# Patient Record
Sex: Female | Born: 1955 | Race: White | Hispanic: No | Marital: Married | Smoking: Never smoker
Health system: Southern US, Community
[De-identification: ages and names within clinical notes are randomized; demographics above are authoritative.]

## PROBLEM LIST (undated history)

## (undated) DIAGNOSIS — K219 Gastro-esophageal reflux disease without esophagitis: Secondary | ICD-10-CM

## (undated) DIAGNOSIS — E0500 Thyrotoxicosis with diffuse goiter without thyrotoxic crisis or storm: Secondary | ICD-10-CM

## (undated) DIAGNOSIS — R000 Tachycardia, unspecified: Secondary | ICD-10-CM

## (undated) DIAGNOSIS — R42 Dizziness and giddiness: Secondary | ICD-10-CM

## (undated) DIAGNOSIS — N83209 Unspecified ovarian cyst, unspecified side: Secondary | ICD-10-CM

## (undated) DIAGNOSIS — K760 Fatty (change of) liver, not elsewhere classified: Secondary | ICD-10-CM

## (undated) DIAGNOSIS — D649 Anemia, unspecified: Secondary | ICD-10-CM

## (undated) DIAGNOSIS — E0590 Thyrotoxicosis, unspecified without thyrotoxic crisis or storm: Secondary | ICD-10-CM

## (undated) DIAGNOSIS — N840 Polyp of corpus uteri: Secondary | ICD-10-CM

## (undated) DIAGNOSIS — M810 Age-related osteoporosis without current pathological fracture: Secondary | ICD-10-CM

## (undated) DIAGNOSIS — M1990 Unspecified osteoarthritis, unspecified site: Secondary | ICD-10-CM

## (undated) HISTORY — PX: COLONOSCOPY: SHX174

---

## 2018-07-23 ENCOUNTER — Other Ambulatory Visit: Payer: Self-pay | Admitting: Obstetrics & Gynecology

## 2018-07-23 DIAGNOSIS — Z1231 Encounter for screening mammogram for malignant neoplasm of breast: Secondary | ICD-10-CM

## 2018-09-02 ENCOUNTER — Ambulatory Visit
Admission: RE | Admit: 2018-09-02 | Discharge: 2018-09-02 | Disposition: A | Discharge status: Home / Self Care | Payer: BC Managed Care – PPO | Source: Ambulatory Visit | Attending: Obstetrics & Gynecology | Admitting: Obstetrics & Gynecology

## 2018-09-02 ENCOUNTER — Encounter: Payer: Self-pay | Admitting: Radiology

## 2018-09-02 DIAGNOSIS — Z1231 Encounter for screening mammogram for malignant neoplasm of breast: Secondary | ICD-10-CM | POA: Diagnosis not present

## 2019-07-27 ENCOUNTER — Other Ambulatory Visit: Payer: Self-pay | Admitting: Obstetrics & Gynecology

## 2019-07-27 DIAGNOSIS — Z1231 Encounter for screening mammogram for malignant neoplasm of breast: Secondary | ICD-10-CM

## 2019-09-06 ENCOUNTER — Encounter (INDEPENDENT_AMBULATORY_CARE_PROVIDER_SITE_OTHER): Payer: Self-pay

## 2019-09-06 ENCOUNTER — Ambulatory Visit
Admission: RE | Admit: 2019-09-06 | Discharge: 2019-09-06 | Disposition: A | Discharge status: Home / Self Care | Payer: 59 | Source: Ambulatory Visit | Attending: Obstetrics & Gynecology | Admitting: Obstetrics & Gynecology

## 2019-09-06 ENCOUNTER — Other Ambulatory Visit: Payer: Self-pay

## 2019-09-06 DIAGNOSIS — Z1231 Encounter for screening mammogram for malignant neoplasm of breast: Secondary | ICD-10-CM | POA: Diagnosis not present

## 2020-05-18 ENCOUNTER — Ambulatory Visit
Admission: EM | Admit: 2020-05-18 | Discharge: 2020-05-18 | Disposition: A | Discharge status: Home / Self Care | Payer: Medicare Other | Attending: Family Medicine | Admitting: Family Medicine

## 2020-05-18 ENCOUNTER — Other Ambulatory Visit: Payer: Self-pay

## 2020-05-18 DIAGNOSIS — U071 COVID-19: Secondary | ICD-10-CM | POA: Insufficient documentation

## 2020-05-18 LAB — SARS CORONAVIRUS 2 (TAT 6-24 HRS): SARS Coronavirus 2: POSITIVE — AB

## 2020-05-18 MED ORDER — MOLNUPIRAVIR 200 MG PO CAPS: 800.0000 mg | ORAL_CAPSULE | Freq: Two times a day (BID) | ORAL | 0 refills | Status: AC

## 2020-05-18 NOTE — ED Triage Notes (Signed)
Pt c/o scratchy throat, headache, nasal congestion and cough since yesterday. Pt reports her husband was seen here yesterday and tested POS to [place]. Pt denies f/n/v/d or other symptoms.

## 2020-05-18 NOTE — Discharge Instructions (Signed)
Rest.  Fluids.  Medication as prescribed.  Quarantine for 5 days from the onset of symptoms.  Take care  Dr. Lacinda Axon

## 2020-05-18 NOTE — ED Provider Notes (Signed)
[facility]    CSN: [id number] Arrival date & time: 05/18/20  1009      History   Chief Complaint Chief Complaint  Patient presents with  . Covid Exposure   HPI  65 year old female presents with recent Covid exposure and now Covid symptoms.  Symptoms started yesterday.  She reports gradual throat, headache, congestion, cough.  She has had some chills and some mild body aches.  Her husband was seen here yesterday and has tested positive COVID-19.  Patient believes that she has COVID-19.  She believes that she may have gotten it from visiting a long-term care facility.  No fever.  No relieving factors.  No other complaints.  Home Medications    Prior to Admission medications   Medication Sig Start Date End Date Taking? Authorizing Provider  alendronate (FOSAMAX) 70 MG tablet Take 70 mg by mouth once a week. 04/04/20  Yes [provider]  Calcium Carbonate (CALCIUM-CARB 600 PO) Take by mouth.   Yes [provider]  Cholecalciferol 25 MCG (1000 UT) tablet Vitamin D3 25 mcg (1,000 unit) tablet  Take 1 tablet twice a day by oral route.   Yes [provider]  cyanocobalamin 1000 MCG tablet Take 2 tablets daily for 2 weeks, then reduce to 1 tablet daily thereafter for Vitamin B12 Deficiency. 03/22/19  Yes [provider]  estradiol (ESTRACE) 0.1 MG/GM vaginal cream SMARTSIG:1 Gram(s) Vaginal 2-3 Times Weekly PRN 02/11/20  Yes [provider]  Molnupiravir 200 MG CAPS Take 4 capsules (800 mg total) by mouth in the morning and at bedtime for 5 days. 05/18/20 05/23/20 Yes Kaitlyne Friedhoff G, DO  triamcinolone (NASACORT) 55 MCG/ACT AERO nasal inhaler Nasacort 55 mcg nasal spray aerosol  Take by nasal route.   Yes [provider]    Family History Family History  Problem Relation Age of Onset  . Breast cancer Neg Hx     Social History Social History   Tobacco Use  . Smoking status: Never Smoker  . Smokeless tobacco: Never Used   Vaping Use  . Vaping Use: Never used  Substance Use Topics  . Alcohol use: Not Currently  . Drug use: Never     Allergies   Patient has no known allergies.   Review of Systems Review of Systems Per HPI  Physical Exam Triage Vital Signs ED Triage Vitals  Enc Vitals Group     BP 05/18/20 1029 126/61     Pulse Rate 05/18/20 1029 (!) 107     Resp 05/18/20 1029 18     Temp 05/18/20 1029 98.4 F (36.9 C)     Temp Source 05/18/20 1029 Oral     SpO2 05/18/20 1029 100 %     Weight 05/18/20 1025 120 lb (54.4 kg)     Height 05/18/20 1025 5\' 5"  (1.651 m)     Head Circumference --      Peak Flow --      Pain Score 05/18/20 1025 0     Pain Loc --      Pain Edu? --      Excl. in [place]? --    Updated Vital Signs BP 126/61 (BP Location: Left Arm)   Pulse (!) 107   Temp 98.4 F (36.9 C) (Oral)   Resp 18   Ht 5\' 5"  (1.651 m)   Wt 54.4 kg   SpO2 100%   BMI 19.97 kg/m   Visual Acuity Right Eye Distance:   Left Eye Distance:  Bilateral Distance:    Right Eye Near:   Left Eye Near:    Bilateral Near:     Physical Exam Vitals and nursing note reviewed.  Constitutional:      General: She is not in acute distress.    Appearance: Normal appearance. She is not ill-appearing.  HENT:     Head: Normocephalic and atraumatic.  Eyes:     General:        Right eye: No discharge.        Left eye: No discharge.     Conjunctiva/sclera: Conjunctivae normal.  Cardiovascular:     Rate and Rhythm: Regular rhythm. Tachycardia present.  Pulmonary:     Effort: Pulmonary effort is normal.     Breath sounds: Normal breath sounds. No wheezing, rhonchi or rales.  Neurological:     Mental Status: She is alert.  Psychiatric:        Mood and Affect: Mood normal.        Behavior: Behavior normal.    UC Treatments / Results  Labs (all labs ordered are listed, but only abnormal results are displayed) Labs Reviewed  SARS CORONAVIRUS 2 (TAT 6-24 HRS)    EKG   Radiology No results  found.  Procedures Procedures (including critical care time)  Medications Ordered in UC Medications - No data to display  Initial Impression / Assessment and Plan / UC Course  I have reviewed the triage vital signs and the nursing notes.  Pertinent labs & imaging results that were available during my care of the patient were reviewed by me and considered in my medical decision making (see chart for details).    65 year old female presents with suspected COVID-19.  Given age, and direct exposure and confident that she has COVID-19.  Placing on Molnupiravir.   Final Clinical Impressions(s) / UC Diagnoses   Final diagnoses:  COVID     Discharge Instructions     Rest.  Fluids.  Medication as prescribed.  Quarantine for 5 days from the onset of symptoms.  Take care  Dr. Lacinda Axon    ED Prescriptions    Medication Sig Dispense Auth. Provider   Molnupiravir 200 MG CAPS Take 4 capsules (800 mg total) by mouth in the morning and at bedtime for 5 days. 40 capsule Coral Spikes, DO     PDMP not reviewed this encounter.   Coral Spikes, DO 05/18/20 1120

## 2020-08-03 ENCOUNTER — Other Ambulatory Visit: Payer: Self-pay | Admitting: Certified Nurse Midwife

## 2020-08-03 DIAGNOSIS — Z1231 Encounter for screening mammogram for malignant neoplasm of breast: Secondary | ICD-10-CM

## 2020-09-06 ENCOUNTER — Ambulatory Visit
Admission: RE | Admit: 2020-09-06 | Discharge: 2020-09-06 | Disposition: A | Discharge status: Home / Self Care | Payer: Medicare Other | Source: Ambulatory Visit | Attending: Certified Nurse Midwife | Admitting: Certified Nurse Midwife

## 2020-09-06 ENCOUNTER — Other Ambulatory Visit: Payer: Self-pay

## 2020-09-06 DIAGNOSIS — Z1231 Encounter for screening mammogram for malignant neoplasm of breast: Secondary | ICD-10-CM | POA: Diagnosis present

## 2020-10-04 ENCOUNTER — Encounter: Payer: Self-pay | Admitting: *Deleted

## 2020-10-05 ENCOUNTER — Ambulatory Visit: Payer: Medicare Other | Admitting: Anesthesiology

## 2020-10-05 ENCOUNTER — Other Ambulatory Visit: Payer: Self-pay

## 2020-10-05 ENCOUNTER — Encounter: Payer: Self-pay | Admitting: *Deleted

## 2020-10-05 ENCOUNTER — Ambulatory Visit
Admission: RE | Admit: 2020-10-05 | Discharge: 2020-10-05 | Disposition: A | Discharge status: Home / Self Care | Payer: Medicare Other | Attending: Gastroenterology | Admitting: Gastroenterology

## 2020-10-05 ENCOUNTER — Encounter
Admission: RE | Disposition: A | Discharge status: Home / Self Care | Payer: Self-pay | Source: Home / Self Care | Attending: Gastroenterology

## 2020-10-05 DIAGNOSIS — Z1211 Encounter for screening for malignant neoplasm of colon: Secondary | ICD-10-CM | POA: Diagnosis not present

## 2020-10-05 DIAGNOSIS — Z87440 Personal history of urinary (tract) infections: Secondary | ICD-10-CM | POA: Diagnosis not present

## 2020-10-05 DIAGNOSIS — Z7983 Long term (current) use of bisphosphonates: Secondary | ICD-10-CM | POA: Diagnosis not present

## 2020-10-05 DIAGNOSIS — K640 First degree hemorrhoids: Secondary | ICD-10-CM | POA: Insufficient documentation

## 2020-10-05 DIAGNOSIS — K5730 Diverticulosis of large intestine without perforation or abscess without bleeding: Secondary | ICD-10-CM | POA: Insufficient documentation

## 2020-10-05 DIAGNOSIS — Z79899 Other long term (current) drug therapy: Secondary | ICD-10-CM | POA: Diagnosis not present

## 2020-10-05 DIAGNOSIS — D120 Benign neoplasm of cecum: Secondary | ICD-10-CM | POA: Diagnosis not present

## 2020-10-05 HISTORY — DX: Thyrotoxicosis, unspecified without thyrotoxic crisis or storm: E05.90

## 2020-10-05 HISTORY — DX: Thyrotoxicosis with diffuse goiter without thyrotoxic crisis or storm: E05.00

## 2020-10-05 HISTORY — PX: COLONOSCOPY WITH PROPOFOL: SHX5780

## 2020-10-05 SURGERY — COLONOSCOPY WITH PROPOFOL
Anesthesia: General

## 2020-10-05 MED ORDER — SODIUM CHLORIDE 0.9 % IV SOLN: INTRAVENOUS | Status: DC

## 2020-10-05 MED ORDER — PHENYLEPHRINE HCL (PRESSORS) 10 MG/ML IV SOLN
INTRAVENOUS | Status: DC | PRN
  Administered 2020-10-05: 100 ug via INTRAVENOUS

## 2020-10-05 MED ORDER — PROPOFOL 10 MG/ML IV BOLUS
INTRAVENOUS | Status: DC | PRN
  Administered 2020-10-05: 80 mg via INTRAVENOUS

## 2020-10-05 MED ORDER — PROPOFOL 500 MG/50ML IV EMUL
INTRAVENOUS | Status: DC | PRN
  Administered 2020-10-05: 150 ug/kg/min via INTRAVENOUS

## 2020-10-05 MED ORDER — LIDOCAINE HCL (CARDIAC) PF 100 MG/5ML IV SOSY
PREFILLED_SYRINGE | INTRAVENOUS | Status: DC | PRN
  Administered 2020-10-05: 50 mg via INTRAVENOUS

## 2020-10-05 NOTE — H&P (Signed)
[facility] Pre-Procedure H&P   Patient ID: Karina Simpson is a 65 y.o. female.  Gastroenterology Provider: Annamaria Helling, DO  Referring Provider: Toni Arthurs, NP PCP: Toni Arthurs, NP  Date: 10/05/2020  HPI Ms. Karina Simpson is a 65 y.o. female who presents today for Colonoscopy for ersonal history of colon polyps.  Patient denies nausea, vomiting, abdominal pain, diarrhea, constipation, melena, hematochezia.  Underwent colonoscopy in 2017 with patient reported Tubular adenoma (x1) with recommended 5 year f/u. Hgb normal. Abdominal surgery- c section.  No other acute gi complaints.   Past Medical History:  Diagnosis Date   Hyperthyroidism     Past Surgical History:  Procedure Laterality Date   CESAREAN SECTION     COLONOSCOPY      Family History Paternal uncle with h/o crc (72) No h/o GI disease or malignancy  Review of Systems  Constitutional:  Negative for activity change, appetite change, chills, fatigue, fever and unexpected weight change.  HENT:  Negative for sinus pain, trouble swallowing and voice change.   Respiratory:  Negative for shortness of breath and wheezing.   Cardiovascular:  Negative for chest pain, palpitations and leg swelling.  Gastrointestinal:  Negative for abdominal distention, abdominal pain, anal bleeding, blood in stool, constipation, diarrhea, nausea, rectal pain and vomiting.  Musculoskeletal:  Negative for arthralgias and myalgias.  Skin:  Negative for color change and pallor.  Neurological:  Negative for dizziness, syncope and weakness.  Psychiatric/Behavioral:  Negative for agitation and confusion.   All other systems reviewed and are negative.   Medications No current facility-administered medications on file prior to encounter.   Current Outpatient Medications on File Prior to Encounter  Medication Sig Dispense Refill   Calcium Carbonate (CALCIUM-CARB 600 PO) Take by mouth.     Cholecalciferol 25 MCG  (1000 UT) tablet Vitamin D3 25 mcg (1,000 unit) tablet  Take 1 tablet twice a day by oral route.     cyanocobalamin 1000 MCG tablet Take 2 tablets daily for 2 weeks, then reduce to 1 tablet daily thereafter for Vitamin B12 Deficiency.     estradiol (ESTRACE) 0.1 MG/GM vaginal cream SMARTSIG:1 Gram(s) Vaginal 2-3 Times Weekly PRN     metoprolol succinate (TOPROL-XL) 25 MG 24 hr tablet Take 25 mg by mouth daily.     triamcinolone (NASACORT) 55 MCG/ACT AERO nasal inhaler Nasacort 55 mcg nasal spray aerosol  Take by nasal route.     White Petrolatum-Mineral Oil (SYSTANE NIGHTTIME OP) Apply 1 application to eye 3 (three) times daily.     alendronate (FOSAMAX) 70 MG tablet Take 70 mg by mouth once a week.      Pertinent medications related to GI and procedure were reviewed by me with the patient prior to the procedure   Current Facility-Administered Medications:    0.9 %  sodium chloride infusion, , Intravenous, Continuous, Annamaria Helling, DO      No Known Allergies Allergies were reviewed by me prior to the procedure  Objective    Vitals:   10/05/20 0800  BP: 128/68  Pulse: 68  Resp: 18  Temp: 97.9 F (36.6 C)  SpO2: 100%  Weight: 57.6 kg  Height: '5\' 5"'$  (1.651 m)     Physical Exam Vitals reviewed.  Constitutional:      General: She is not in acute distress.    Appearance: Normal appearance. She is not ill-appearing, toxic-appearing or diaphoretic.  HENT:     Head: Normocephalic and atraumatic.     Nose: Nose normal.  Mouth/Throat:     Mouth: Mucous membranes are moist.     Pharynx: Oropharynx is clear.  Eyes:     General: No scleral icterus.    Extraocular Movements: Extraocular movements intact.  Cardiovascular:     Rate and Rhythm: Normal rate and regular rhythm.     Heart sounds: Normal heart sounds. No murmur heard.   No friction rub. No gallop.  Pulmonary:     Effort: Pulmonary effort is normal. No respiratory distress.     Breath sounds: Normal  breath sounds. No wheezing, rhonchi or rales.  Abdominal:     General: Bowel sounds are normal. There is no distension.     Palpations: Abdomen is soft.     Tenderness: There is no abdominal tenderness. There is no guarding or rebound.  Musculoskeletal:     Cervical back: Neck supple.     Right lower leg: No edema.     Left lower leg: No edema.  Skin:    General: Skin is warm and dry.     Coloration: Skin is not jaundiced or pale.     Findings: No rash.  Neurological:     General: No focal deficit present.     Mental Status: She is alert and oriented to person, place, and time. Mental status is at baseline.  Psychiatric:        Mood and Affect: Mood normal.        Behavior: Behavior normal.        Thought Content: Thought content normal.        Judgment: Judgment normal.     Assessment:  Ms. Karina Simpson is a 65 y.o. female  who presents today for Colonoscopy for personal history of colon polyps.  Plan:  Colonoscopy with possible intervention today  Colonoscopy with possible biopsy, control of bleeding, polypectomy, and interventions as necessary has been discussed with the patient/patient representative. Informed consent was obtained from the patient/patient representative after explaining the indication, nature, and risks of the procedure including but not limited to death, bleeding, perforation, missed neoplasm/lesions, cardiorespiratory compromise, and reaction to medications. Opportunity for questions was given and appropriate answers were provided. Patient/patient representative has verbalized understanding is amenable to undergoing the procedure.   Annamaria Helling, DO  [facility]  Portions of the record may have been created with voice recognition software. Occasional wrong-word or 'sound-a-like' substitutions may have occurred due to the inherent limitations of voice recognition software.  Read the chart carefully and recognize, using context, where  substitutions may have occurred.

## 2020-10-05 NOTE — Anesthesia Procedure Notes (Signed)
Date/Time: 10/05/2020 9:40 AM Performed by: Johnna Acosta, CRNA Pre-anesthesia Checklist: Patient identified, Emergency Drugs available, Suction available and Patient being monitored Patient Re-evaluated:Patient Re-evaluated prior to induction Oxygen Delivery Method: Nasal cannula Preoxygenation: Pre-oxygenation with 100% oxygen Induction Type: IV induction

## 2020-10-05 NOTE — Anesthesia Preprocedure Evaluation (Addendum)
Anesthesia Evaluation  Patient identified by MRN, date of birth, ID band Patient awake    Reviewed: Allergy & Precautions, NPO status , Patient's Chart, lab work & pertinent test results, reviewed documented beta blocker date and time   Airway Mallampati: II  TM Distance: >3 FB Neck ROM: full    Dental  (+) Teeth Intact   Pulmonary neg pulmonary ROS,    Pulmonary exam normal        Cardiovascular Normal cardiovascular exam+ dysrhythmias (tachycardia)      Neuro/Psych negative neurological ROS  negative psych ROS   GI/Hepatic negative GI ROS, Neg liver ROS,   Endo/Other  Hyperthyroidism   Renal/GU negative Renal ROS  negative genitourinary   Musculoskeletal   Abdominal Normal abdominal exam  (+)   Peds  Hematology negative hematology ROS (+)   Anesthesia Other Findings Past Medical History: No date: Hyperthyroidism  Past Surgical History: No date: CESAREAN SECTION No date: COLONOSCOPY     Reproductive/Obstetrics negative OB ROS                            Anesthesia Physical Anesthesia Plan  ASA: 2  Anesthesia Plan: General   Post-op Pain Management:    Induction: Intravenous  PONV Risk Score and Plan: 3 and Propofol infusion, TIVA and Treatment may vary due to age or medical condition  Airway Management Planned: Natural Airway and Nasal Cannula  Additional Equipment:   Intra-op Plan:   Post-operative Plan:   Informed Consent: I have reviewed the patients History and Physical, chart, labs and discussed the procedure including the risks, benefits and alternatives for the proposed anesthesia with the patient or authorized representative who has indicated his/her understanding and acceptance.     Dental Advisory Given  Plan Discussed with: Anesthesiologist, CRNA and Surgeon  Anesthesia Plan Comments:         Anesthesia Quick Evaluation

## 2020-10-05 NOTE — Anesthesia Postprocedure Evaluation (Signed)
Anesthesia Post Note  Patient: Karina Simpson  Procedure(s) Performed: COLONOSCOPY WITH PROPOFOL  Patient location during evaluation: Endoscopy Anesthesia Type: General Level of consciousness: awake and alert Pain management: pain level controlled Vital Signs Assessment: post-procedure vital signs reviewed and stable Respiratory status: spontaneous breathing, nonlabored ventilation and respiratory function stable Cardiovascular status: blood pressure returned to baseline and stable Postop Assessment: no apparent nausea or vomiting Anesthetic complications: no   No notable events documented.   Last Vitals:  Vitals:   10/05/20 1038 10/05/20 1040  BP: (!) 101/47 (!) 103/56  Pulse: 63 64  Resp: 19 15  Temp:    SpO2: 100% 96%    Last Pain:  Vitals:   10/05/20 1020  TempSrc: Temporal  PainSc:                  [place] Ouch

## 2020-10-05 NOTE — Transfer of Care (Signed)
Immediate Anesthesia Transfer of Care Note  Patient: Karina Simpson  Procedure(s) Performed: COLONOSCOPY WITH PROPOFOL  Patient Location: PACU  Anesthesia Type:General  Level of Consciousness: sedated  Airway & Oxygen Therapy: Patient Spontanous Breathing  Post-op Assessment: Report given to RN and Post -op Vital signs reviewed and stable  Post vital signs: Reviewed and stable  Last Vitals:  Vitals Value Taken Time  BP 103/56 10/05/20 1040  Temp 35.6 C 10/05/20 1020  Pulse 59 10/05/20 1044  Resp 12 10/05/20 1046  SpO2 100 % 10/05/20 1044  Vitals shown include unvalidated device data.  Last Pain:  Vitals:   10/05/20 1020  TempSrc: Temporal  PainSc:          Complications: No notable events documented.

## 2020-10-05 NOTE — Op Note (Signed)
[facility] Patient Name: Karina Simpson Procedure Date: 10/05/2020 9:38 AM MRN: [id number] Account #: [id number] Date of Birth: 05/21/1955 Admit Type: Outpatient Age: 65 Room: [facility] ENDO ROOM 1 Gender: Female Note Status: Finalized Procedure:             Colonoscopy Indications:           High risk colon cancer surveillance: Personal history                         of colonic polyps Providers:             Rueben Bash, DO Referring MD:          Toni Arthurs (Referring MD) Medicines:             Monitored Anesthesia Care Complications:         No immediate complications. Estimated blood loss:                         Minimal. Procedure:             Pre-Anesthesia Assessment:                        - Prior to the procedure, a History and Physical was                         performed, and patient medications and allergies were                         reviewed. The patient is competent. The risks and                         benefits of the procedure and the sedation options and                         risks were discussed with the patient. All questions                         were answered and informed consent was obtained.                         Patient identification and proposed procedure were                         verified by the physician, the nurse, the anesthetist                         and the technician in the endoscopy suite. Mental                         Status Examination: alert and oriented. Airway                         Examination: normal oropharyngeal airway and neck                         mobility. Respiratory Examination: clear to  auscultation. CV Examination: RRR, no murmurs, no S3                         or S4. Prophylactic Antibiotics: The patient does not                         require prophylactic antibiotics. Prior                         Anticoagulants: The patient has taken no  previous                         anticoagulant or antiplatelet agents. ASA Grade                         Assessment: II - A patient with mild systemic disease.                         After reviewing the risks and benefits, the patient                         was deemed in satisfactory condition to undergo the                         procedure. The anesthesia plan was to use monitored                         anesthesia care (MAC). Immediately prior to                         administration of medications, the patient was                         re-assessed for adequacy to receive sedatives. The                         heart rate, respiratory rate, oxygen saturations,                         blood pressure, adequacy of pulmonary ventilation, and                         response to care were monitored throughout the                         procedure. The physical status of the patient was                         re-assessed after the procedure.                        After obtaining informed consent, the colonoscope was                         passed under direct vision. Throughout the procedure,                         the patient's blood pressure, pulse, and oxygen  saturations were monitored continuously. The                         Colonoscope was introduced through the anus and                         advanced to the the terminal ileum, with                         identification of the appendiceal orifice and IC                         valve. The colonoscopy was performed without                         difficulty. The patient tolerated the procedure well.                         The quality of the bowel preparation was evaluated                         using the BBPS [facility] Bowel Preparation Scale) with                         scores of: Right Colon = 2 (minor amount of residual                         staining, small fragments of stool and/or opaque                          liquid, but mucosa seen well), Transverse Colon = 2                         (minor amount of residual staining, small fragments of                         stool and/or opaque liquid, but mucosa seen well) and                         Left Colon = 3 (entire mucosa seen well with no                         residual staining, small fragments of stool or opaque                         liquid). The total BBPS score equals 7. The quality of                         the bowel preparation was good. The terminal ileum,                         ileocecal valve, appendiceal orifice, and rectum were                         photographed. Findings:      The perianal and digital rectal examinations were normal. Pertinent       negatives include normal sphincter  tone.      The terminal ileum appeared normal. Estimated blood loss: none.      A 2 to 3 mm polyp was found in the cecum. The polyp was sessile. The       polyp was removed with a cold biopsy forceps. Resection and retrieval       were complete. Estimated blood loss was minimal.      Non-bleeding internal hemorrhoids were found during retroflexion. The       hemorrhoids were Grade I (internal hemorrhoids that do not prolapse).      The exam was otherwise without abnormality on direct and retroflexion       views.      Multiple small-mouthed diverticula were found in the sigmoid colon and       descending colon. Estimated blood loss: none. Impression:            - The examined portion of the ileum was normal.                        - One 2 to 3 mm polyp in the cecum, removed with a                         cold biopsy forceps. Resected and retrieved.                        - Non-bleeding internal hemorrhoids.                        - The examination was otherwise normal on direct and                         retroflexion views. Recommendation:        - Discharge patient to home.                        - Resume previous diet.                         - Continue present medications.                        - Await pathology results.                        - Repeat colonoscopy for surveillance based on                         pathology results.                        - Return to referring physician. Procedure Code(s):     --- Professional ---                        [phone], Colonoscopy, flexible; with biopsy, single or                         multiple Diagnosis Code(s):     --- Professional ---                        Z86.010, Personal history of colonic polyps  K64.0, First degree hemorrhoids                        K63.5, Polyp of colon CPT copyright 2019 [employer]. All rights reserved. The codes documented in this report are preliminary and upon coder review may  be revised to meet current compliance requirements. Attending Participation:      I personally performed the entire procedure. Volney American, DO Annamaria Helling DO, DO 10/05/2020 10:19:29 AM This report has been signed electronically. Number of Addenda: 0 Note Initiated On: 10/05/2020 9:38 AM Scope Withdrawal Time: 0 hours 14 minutes 36 seconds  Total Procedure Duration: 0 hours 30 minutes 4 seconds  Estimated Blood Loss:  Estimated blood loss was minimal.      [facility]

## 2020-10-06 ENCOUNTER — Encounter: Payer: Self-pay | Admitting: Gastroenterology

## 2020-10-06 LAB — SURGICAL PATHOLOGY

## 2021-08-08 ENCOUNTER — Other Ambulatory Visit: Payer: Self-pay | Admitting: Obstetrics and Gynecology

## 2021-08-08 DIAGNOSIS — Z1231 Encounter for screening mammogram for malignant neoplasm of breast: Secondary | ICD-10-CM

## 2021-09-20 ENCOUNTER — Ambulatory Visit
Admission: RE | Admit: 2021-09-20 | Discharge: 2021-09-20 | Disposition: A | Discharge status: Home / Self Care | Payer: Medicare Other | Source: Ambulatory Visit | Attending: Obstetrics and Gynecology | Admitting: Obstetrics and Gynecology

## 2021-09-20 DIAGNOSIS — Z1231 Encounter for screening mammogram for malignant neoplasm of breast: Secondary | ICD-10-CM | POA: Diagnosis present

## 2021-11-24 DIAGNOSIS — S199XXA Unspecified injury of neck, initial encounter: Secondary | ICD-10-CM

## 2021-11-24 HISTORY — DX: Unspecified injury of neck, initial encounter: S19.9XXA

## 2022-01-16 ENCOUNTER — Ambulatory Visit
Admission: EM | Admit: 2022-01-16 | Discharge: 2022-01-16 | Disposition: A | Discharge status: Home / Self Care | Payer: Medicare Other | Attending: Family Medicine | Admitting: Family Medicine

## 2022-01-16 ENCOUNTER — Encounter: Payer: Self-pay | Admitting: Emergency Medicine

## 2022-01-16 DIAGNOSIS — Z20822 Contact with and (suspected) exposure to covid-19: Secondary | ICD-10-CM

## 2022-01-16 DIAGNOSIS — U071 COVID-19: Secondary | ICD-10-CM

## 2022-01-16 MED ORDER — NIRMATRELVIR/RITONAVIR (PAXLOVID)TABLET: 3.0000 | ORAL_TABLET | Freq: Two times a day (BID) | ORAL | 0 refills | Status: AC

## 2022-01-16 MED ORDER — NIRMATRELVIR/RITONAVIR (PAXLOVID)TABLET: 3.0000 | ORAL_TABLET | Freq: Two times a day (BID) | ORAL | 0 refills | Status: DC

## 2022-01-16 MED ORDER — MOLNUPIRAVIR EUA 200MG CAPSULE: 4.0000 | ORAL_CAPSULE | Freq: Two times a day (BID) | ORAL | 0 refills | Status: DC

## 2022-01-16 NOTE — Discharge Instructions (Addendum)
Your home test for COVID-19 was positive, meaning that you were infected with the novel coronavirus and could give the germ to others.  Please continue isolation at home for at least 5 days since the start of your symptoms. Once you complete your 5 day quarantine, you may return to normal activities as long as you've not had a fever for over 24 hours(without taking fever reducing medicine) and your symptoms are improving. Be sure to wear a mask until Day 11.   If your were prescribed medication. Stop by the pharmacy to pick it up.   Please continue good preventive care measures, including:  frequent hand-washing, avoid touching your face, cover coughs/sneezes, stay out of crowds and keep a 6 foot distance from others.  Go to the nearest hospital emergency room if fever/cough/breathlessness are severe or illness seems like a threat to life.  You can take Tylenol and/or Ibuprofen as needed for fever reduction and pain relief.    For cough: honey 1/2 to 1 teaspoon (you can dilute the honey in water or another fluid).  You can also use guaifenesin and dextromethorphan for cough. You can use a humidifier for chest congestion and cough.  If you don't have a humidifier, you can sit in the bathroom with the hot shower running.      For sore throat: try warm salt water gargles, Mucinex sore throat cough drops or cepacol lozenges, throat spray, warm tea or water with lemon/honey, popsicles or ice, or OTC cold relief medicine for throat discomfort. You can also purchase chloraseptic spray at the pharmacy or dollar store.   For congestion: take a daily anti-histamine like Zyrtec, Claritin, and a oral decongestant, such as pseudoephedrine.  You can also use Flonase 1-2 sprays in each nostril daily. Afrin is also a good option, if you do not have high blood pressure.    It is important to stay hydrated: drink plenty of fluids (water, gatorade/powerade/pedialyte, juices, or teas) to keep your throat moisturized and  help further relieve irritation/discomfort.    Return or go to the Emergency Department if symptoms worsen or do not improve in the next few days

## 2022-01-16 NOTE — ED Provider Notes (Signed)
[facility]    CSN: [id number] Arrival date & time: 01/16/22  1122      History   Chief Complaint Chief Complaint  Patient presents with   Covid Positive    HPI Karina Simpson is a 66 y.o. female.   HPI   Karina Simpson presents after having a positive home COVID test this morning. Of note, her husband tested positive for COVID on Monday.  Symptoms started yesterday.  Has cough, rhinorrhea and sore throat. Denies fever, body aches, nasal congestion, headache, vomiting or diarrhea. No chest pain or shortness of breath.        Past Medical History:  Diagnosis Date   Graves disease    Hyperthyroidism     There are no problems to display for this patient.   Past Surgical History:  Procedure Laterality Date   CESAREAN SECTION     COLONOSCOPY     COLONOSCOPY WITH PROPOFOL N/A 10/05/2020   Procedure: COLONOSCOPY WITH PROPOFOL;  Surgeon: Annamaria Helling, DO;  Location: [facility] ENDOSCOPY;  Service: Gastroenterology;  Laterality: N/A;  REQUESTS EARLY AM    OB History   No obstetric history on file.      Home Medications    Prior to Admission medications   Medication Sig Start Date End Date Taking? Authorizing Provider  meloxicam (MOBIC) 7.5 MG tablet Take by mouth. 12/31/21 03/01/22 Yes [provider]  molnupiravir EUA (LAGEVRIO) 200 mg CAPS capsule Take 4 capsules (800 mg total) by mouth 2 (two) times daily for 5 days. 01/16/22 01/21/22 Yes Tehilla Coffel, DO  alendronate (FOSAMAX) 70 MG tablet Take 70 mg by mouth once a week. 04/04/20   [provider]  Calcium Carbonate (CALCIUM-CARB 600 PO) Take by mouth.    [provider]  Cholecalciferol 25 MCG (1000 UT) tablet Vitamin D3 25 mcg (1,000 unit) tablet  Take 1 tablet twice a day by oral route.    [provider]  cyanocobalamin 1000 MCG tablet Take 2 tablets daily for 2 weeks, then reduce to 1 tablet daily thereafter for Vitamin B12 Deficiency. 03/22/19   [provider]  estradiol (ESTRACE) 0.1 MG/GM vaginal cream SMARTSIG:1 Gram(s) Vaginal 2-3 Times Weekly PRN 02/11/20   [provider]  metoprolol succinate (TOPROL-XL) 25 MG 24 hr tablet Take 25 mg by mouth daily.    [provider]  triamcinolone (NASACORT) 55 MCG/ACT AERO nasal inhaler Nasacort 55 mcg nasal spray aerosol  Take by nasal route.    [provider]  White Petrolatum-Mineral Oil (SYSTANE NIGHTTIME OP) Apply 1 application to eye 3 (three) times daily.    [provider]    Family History Family History  Problem Relation Age of Onset   Breast cancer Neg Hx     Social History Social History   Tobacco Use   Smoking status: Never   Smokeless tobacco: Never  Vaping Use   Vaping Use: Never used  Substance Use Topics   Alcohol use: Not Currently   Drug use: Never     Allergies   Patient has no known allergies.   Review of Systems Review of Systems: negative unless otherwise stated in HPI.      Physical Exam Triage Vital Signs ED Triage Vitals  Enc Vitals Group     BP 01/16/22 1312 (!) 148/74     Pulse Rate 01/16/22 1312 81     Resp 01/16/22 1312 16     Temp 01/16/22 1312 98.6 F (37 C)  Temp Source 01/16/22 1312 Oral     SpO2 01/16/22 1312 98 %     Weight --      Height --      Head Circumference --      Peak Flow --      Pain Score 01/16/22 1311 0     Pain Loc --      Pain Edu? --      Excl. in [place]? --    No data found.  Updated Vital Signs BP (!) 148/74 (BP Location: Right Arm)   Pulse 81   Temp 98.6 F (37 C) (Oral)   Resp 16   SpO2 98%   Visual Acuity Right Eye Distance:   Left Eye Distance:   Bilateral Distance:    Right Eye Near:   Left Eye Near:    Bilateral Near:     Physical Exam GEN:     alert, non-toxic appearing female in no distress    HENT:  mucus membranes moist, oropharyngeal without lesions or exudate, no tonsillar hypertrophy, no oropharyngeal erythema, clear nasal discharge,  bilateral TM normal EYES:   pupils equal and reactive, no scleral injection or discharge NECK:  baseline ROM, no meningismus   RESP:  no increased work of breathing, clear to auscultation bilaterally CVS:   regular rate and rhythm Skin:   warm and dry    UC Treatments / Results  Labs (all labs ordered are listed, but only abnormal results are displayed) Labs Reviewed - No data to display  EKG   Radiology No results found.  Procedures Procedures (including critical care time)  Medications Ordered in UC Medications - No data to display  Initial Impression / Assessment and Plan / UC Course  I have reviewed the triage vital signs and the nursing notes.  Pertinent labs & imaging results that were available during my care of the patient were reviewed by me and considered in my medical decision making (see chart for details).       Patient is a 65 y.o. female who presents after testing positive for at home with known close COVID exposure.  She has had COVID once before. Overall she is non-toxic appearing, well-hydrated and in no respiratory distress. I saw her COVID positive home test. Repeat COVID testing deferred.    Discussed symptomatic treatment. Pulmonary exam she has equal aeration bilaterally, imaging deferred.  She is  interested in treatment for COVID. After shared decision making, she is interested in [facility] as has taken this before.  Rx sent to pharmacy.  Work note declined. Isolation and quarantine instructions provided. Typical duration of symptoms discussed. ED and return precautions and understanding voiced. Discussed MDM, treatment plan and plan for follow-up with patient/parent who agrees with plan.      Final Clinical Impressions(s) / UC Diagnoses   Final diagnoses:  [id number]     Discharge Instructions      Your home test for COVID-19 was positive, meaning that you were infected with the novel coronavirus and could give the germ to others.  Please  continue isolation at home for at least 5 days since the start of your symptoms. Once you complete your 5 day quarantine, you may return to normal activities as long as you've not had a fever for over 24 hours(without taking fever reducing medicine) and your symptoms are improving. Be sure to wear a mask until Day 11.   If your were prescribed medication. Stop by the pharmacy to pick it up.  Please continue good preventive care measures, including:  frequent hand-washing, avoid touching your face, cover coughs/sneezes, stay out of crowds and keep a 6 foot distance from others.  Go to the nearest hospital emergency room if fever/cough/breathlessness are severe or illness seems like a threat to life.  You can take Tylenol and/or Ibuprofen as needed for fever reduction and pain relief.    For cough: honey 1/2 to 1 teaspoon (you can dilute the honey in water or another fluid).  You can also use guaifenesin and dextromethorphan for cough. You can use a humidifier for chest congestion and cough.  If you don't have a humidifier, you can sit in the bathroom with the hot shower running.      For sore throat: try warm salt water gargles, Mucinex sore throat cough drops or cepacol lozenges, throat spray, warm tea or water with lemon/honey, popsicles or ice, or OTC cold relief medicine for throat discomfort. You can also purchase chloraseptic spray at the pharmacy or dollar store.   For congestion: take a daily anti-histamine like Zyrtec, Claritin, and a oral decongestant, such as pseudoephedrine.  You can also use Flonase 1-2 sprays in each nostril daily. Afrin is also a good option, if you do not have high blood pressure.    It is important to stay hydrated: drink plenty of fluids (water, gatorade/powerade/pedialyte, juices, or teas) to keep your throat moisturized and help further relieve irritation/discomfort.    Return or go to the Emergency Department if symptoms worsen or do not improve in the next few  days      ED Prescriptions     Medication Sig Dispense Auth. Provider   molnupiravir EUA (LAGEVRIO) 200 mg CAPS capsule Take 4 capsules (800 mg total) by mouth 2 (two) times daily for 5 days. 40 capsule Airam Heidecker, DO      PDMP not reviewed this encounter.   Lyndee Hensen, DO 01/16/22 1336

## 2022-01-16 NOTE — ED Triage Notes (Signed)
Pt presents with runny nose, sore throat and cough since yesterday. At home covid test this morning was positive.

## 2022-01-24 IMAGING — MG MM DIGITAL SCREENING BILAT W/ TOMO AND CAD
8 series · 9 of 24 positions shown · non-contrast
Comparison: Previous exam(s).

CLINICAL DATA: Screening.

EXAM:
DIGITAL SCREENING BILATERAL MAMMOGRAM WITH TOMOSYNTHESIS AND CAD
TECHNIQUE: Bilateral screening digital craniocaudal and mediolateral oblique
mammograms were obtained. Bilateral screening digital breast
tomosynthesis was performed. The images were evaluated with
computer-aided detection.

[L MLO synth-2D]
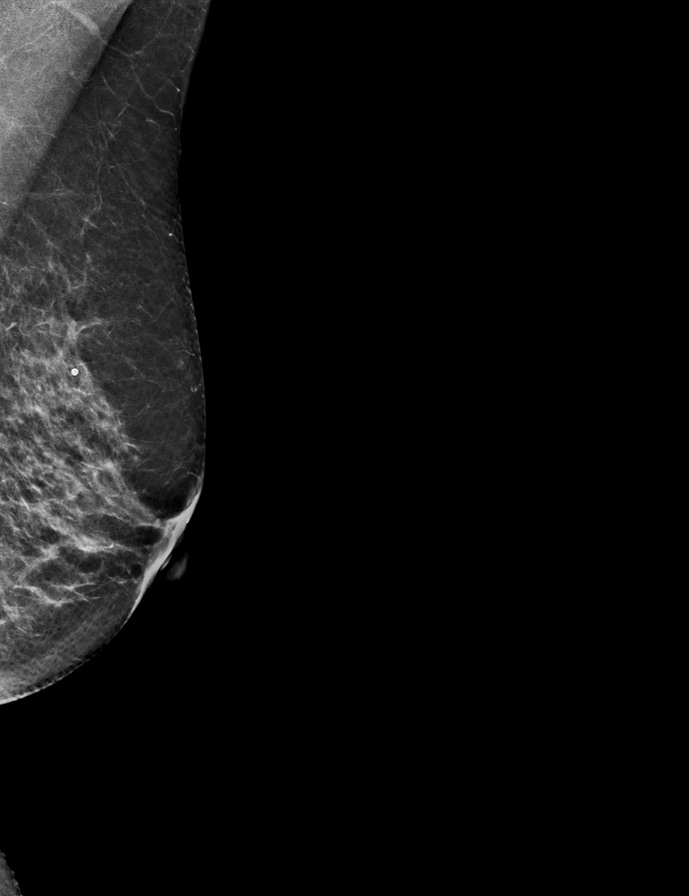

[R MLO synth-2D]
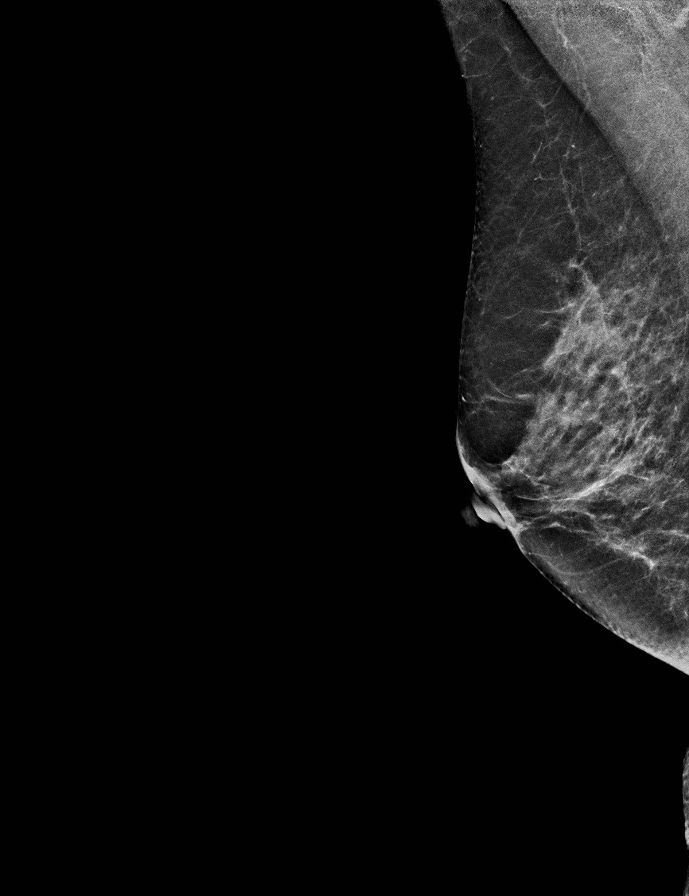

[L CC synth-2D]
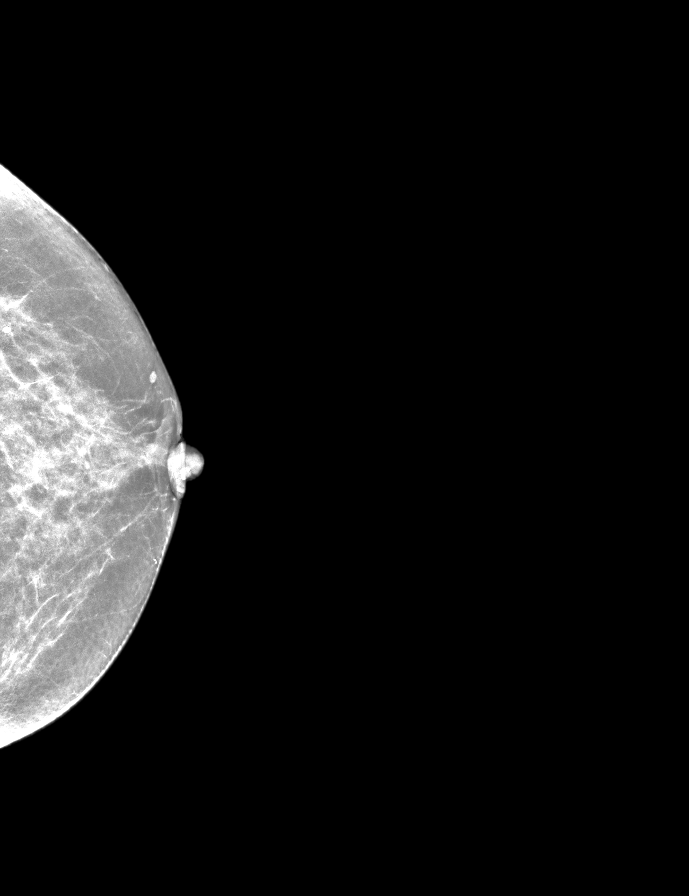

[R CC synth-2D]
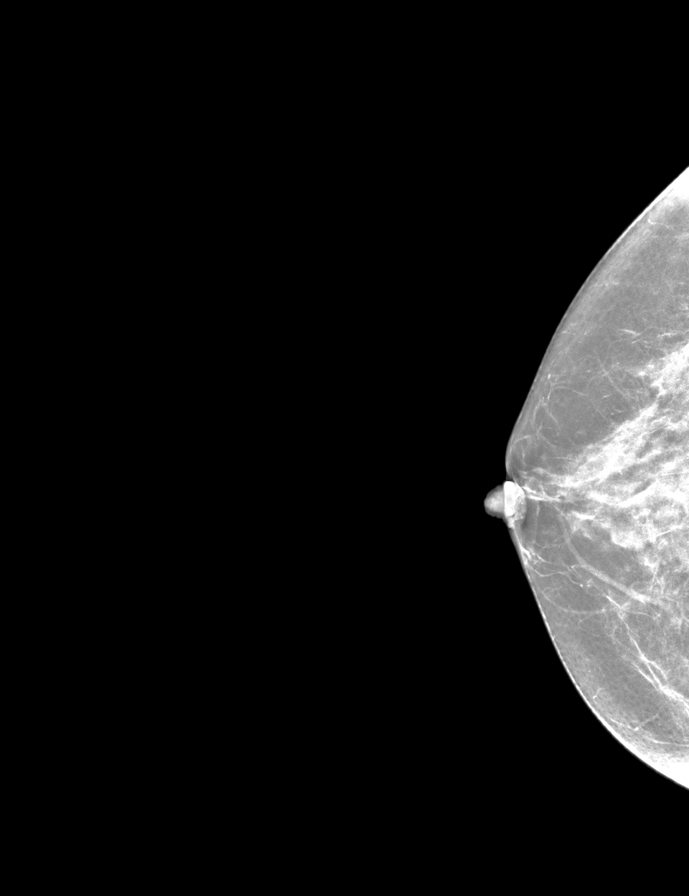

[L MLO tomo · 2 of 55 frames shown]
[frame 18/55]
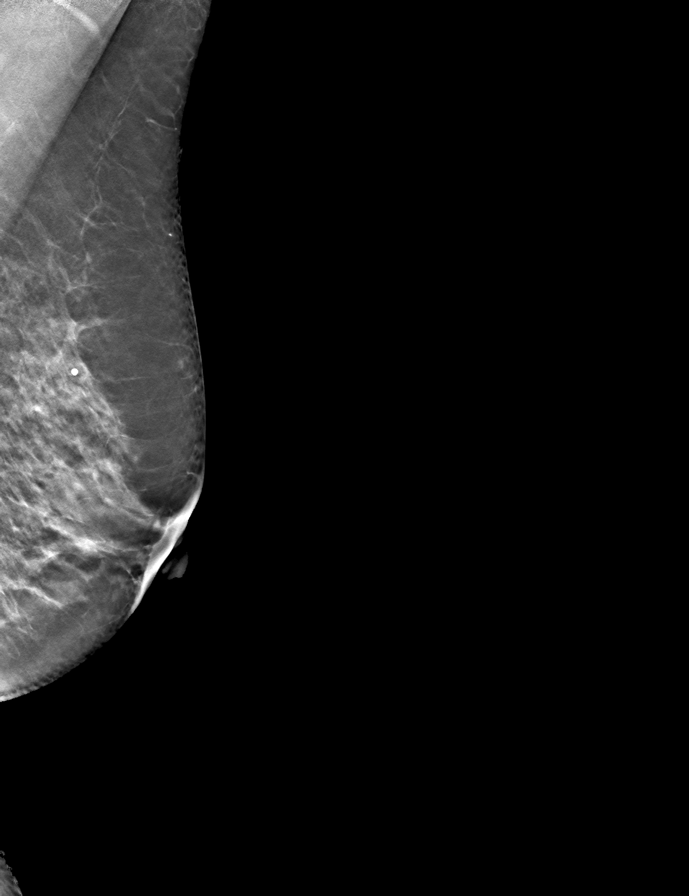
[frame 28/55]
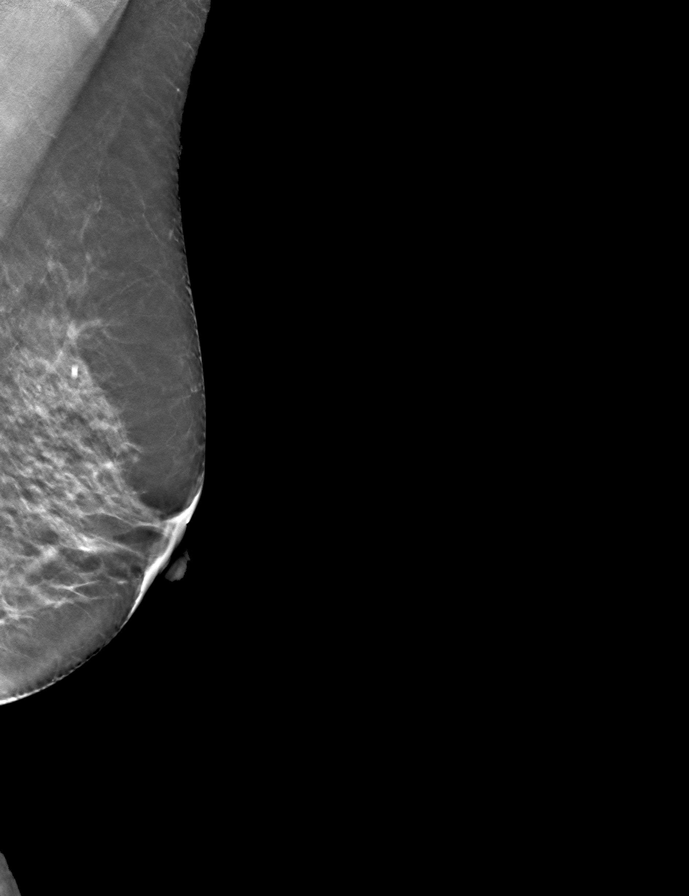

[R MLO tomo · tomo slice 29/56.0]
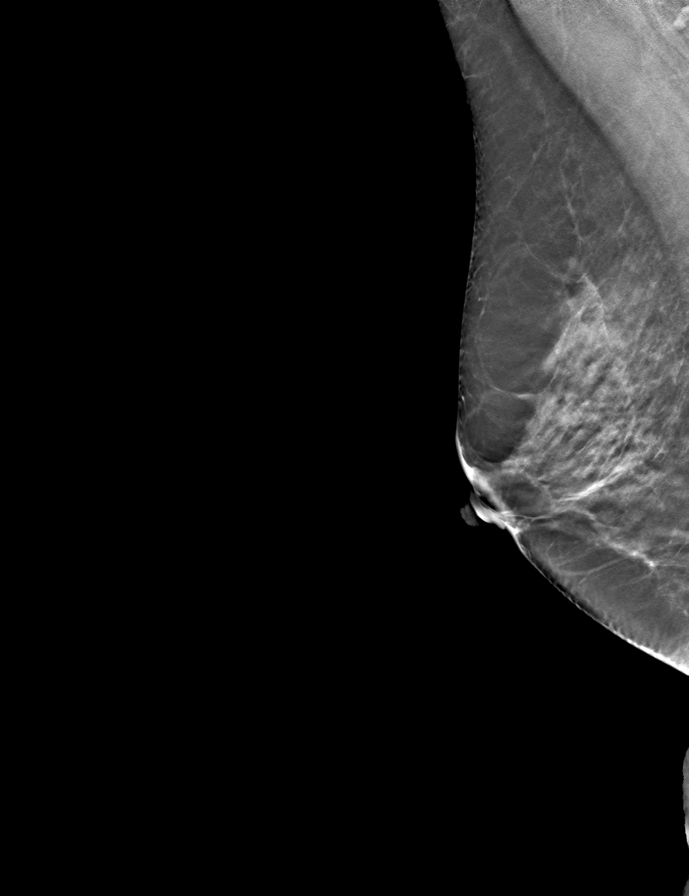

[L CC tomo · tomo slice 27/54.0]
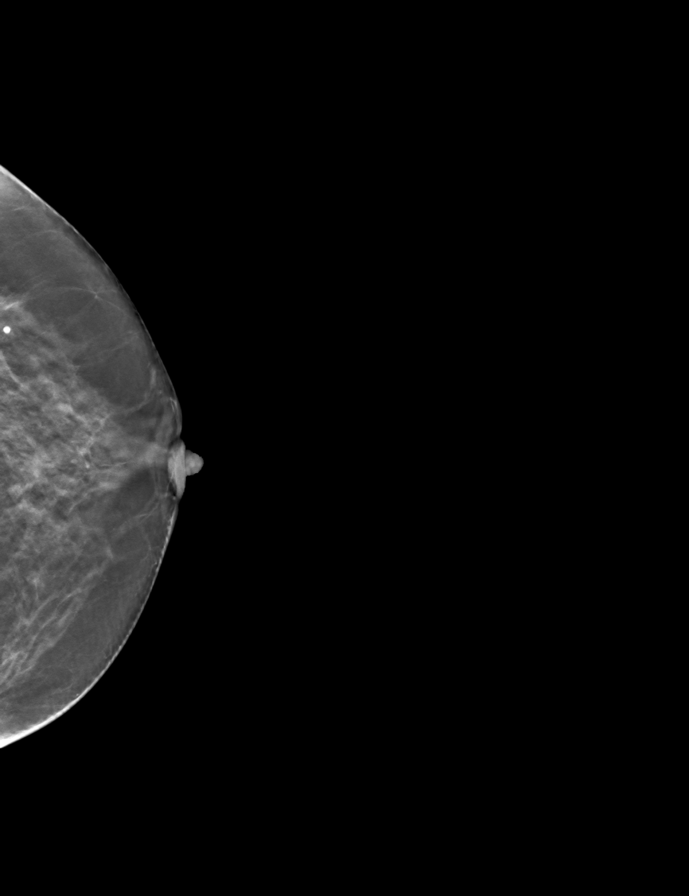

[R CC tomo · tomo slice 28/55.0]
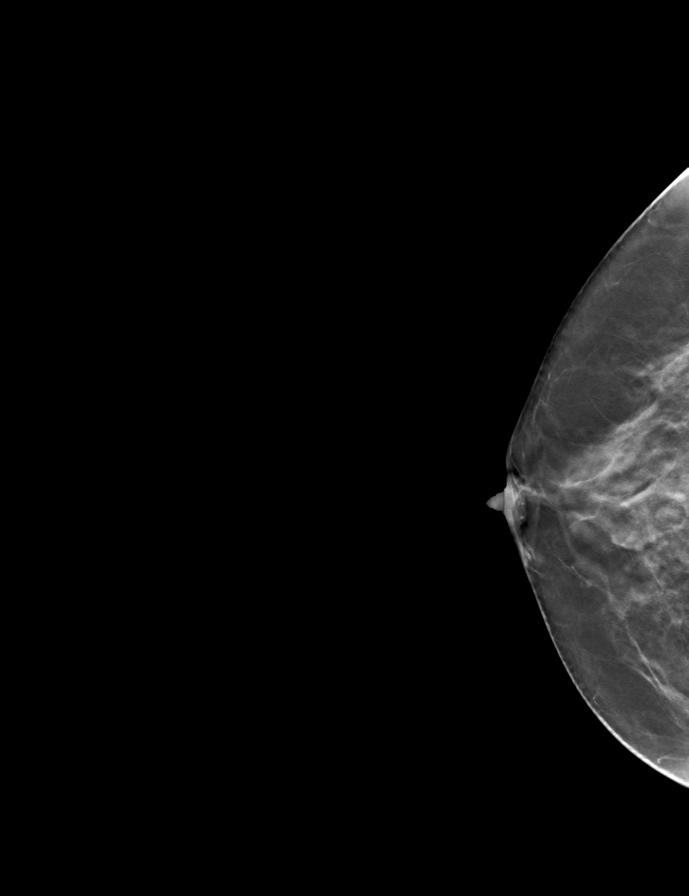

[9 of 24 positions shown; findings below may reference images not displayed]

ACR Breast Density Category c: The breast tissue is heterogeneously
dense, which may obscure small masses.
FINDINGS: There are no findings suspicious for malignancy.
IMPRESSION: No mammographic evidence of malignancy. A result letter of this
screening mammogram will be mailed directly to the patient.

RECOMMENDATION:
Screening mammogram in one year. (Code:Q3-W-BC3)

BI-RADS CATEGORY  1: Negative.

## 2022-04-02 ENCOUNTER — Encounter: Payer: Self-pay | Admitting: Ophthalmology

## 2022-04-04 NOTE — Discharge Instructions (Signed)

## 2022-04-09 ENCOUNTER — Ambulatory Visit: Payer: Medicare Other | Admitting: Anesthesiology

## 2022-04-09 ENCOUNTER — Encounter
Admission: RE | Disposition: A | Discharge status: Home / Self Care | Payer: Self-pay | Source: Ambulatory Visit | Attending: Ophthalmology

## 2022-04-09 ENCOUNTER — Encounter: Payer: Self-pay | Admitting: Ophthalmology

## 2022-04-09 ENCOUNTER — Other Ambulatory Visit: Payer: Self-pay

## 2022-04-09 ENCOUNTER — Ambulatory Visit
Admission: RE | Admit: 2022-04-09 | Discharge: 2022-04-09 | Disposition: A | Discharge status: Home / Self Care | Payer: Medicare Other | Source: Ambulatory Visit | Attending: Ophthalmology | Admitting: Ophthalmology

## 2022-04-09 DIAGNOSIS — H2512 Age-related nuclear cataract, left eye: Secondary | ICD-10-CM | POA: Diagnosis present

## 2022-04-09 DIAGNOSIS — I10 Essential (primary) hypertension: Secondary | ICD-10-CM | POA: Insufficient documentation

## 2022-04-09 HISTORY — DX: Unspecified osteoarthritis, unspecified site: M19.90

## 2022-04-09 HISTORY — DX: Dizziness and giddiness: R42

## 2022-04-09 HISTORY — DX: Tachycardia, unspecified: R00.0

## 2022-04-09 HISTORY — PX: CATARACT EXTRACTION W/PHACO: SHX586

## 2022-04-09 HISTORY — DX: Age-related osteoporosis without current pathological fracture: M81.0

## 2022-04-09 SURGERY — PHACOEMULSIFICATION, CATARACT, WITH IOL INSERTION
Anesthesia: Monitor Anesthesia Care | Site: Eye | Laterality: Left

## 2022-04-09 MED ORDER — FENTANYL CITRATE PF 50 MCG/ML IJ SOSY: 25.0000 ug | PREFILLED_SYRINGE | INTRAMUSCULAR | Status: DC | PRN

## 2022-04-09 MED ORDER — SIGHTPATH DOSE#1 NA CHONDROIT SULF-NA HYALURON 40-17 MG/ML IO SOLN
INTRAOCULAR | Status: DC | PRN
  Administered 2022-04-09: 1 mL via INTRAOCULAR

## 2022-04-09 MED ORDER — SIGHTPATH DOSE#1 BSS IO SOLN
INTRAOCULAR | Status: DC | PRN
  Administered 2022-04-09: 15 mL via INTRAOCULAR

## 2022-04-09 MED ORDER — MIDAZOLAM HCL 2 MG/2ML IJ SOLN
INTRAMUSCULAR | Status: DC | PRN
  Administered 2022-04-09: 1 mg via INTRAVENOUS

## 2022-04-09 MED ORDER — FENTANYL CITRATE (PF) 100 MCG/2ML IJ SOLN
INTRAMUSCULAR | Status: DC | PRN
  Administered 2022-04-09: 50 ug via INTRAVENOUS

## 2022-04-09 MED ORDER — MOXIFLOXACIN HCL 0.5 % OP SOLN
OPHTHALMIC | Status: DC | PRN
  Administered 2022-04-09: .2 mL via OPHTHALMIC

## 2022-04-09 MED ORDER — SIGHTPATH DOSE#1 BSS IO SOLN
INTRAOCULAR | Status: DC | PRN
  Administered 2022-04-09: 54 mL via OPHTHALMIC

## 2022-04-09 MED ORDER — ONDANSETRON HCL 4 MG/2ML IJ SOLN: 4.0000 mg | Freq: Once | INTRAMUSCULAR | Status: DC | PRN

## 2022-04-09 MED ORDER — SIGHTPATH DOSE#1 BSS IO SOLN
INTRAOCULAR | Status: DC | PRN
  Administered 2022-04-09: 2 mL

## 2022-04-09 MED ORDER — TETRACAINE HCL 0.5 % OP SOLN
1.0000 [drp] | OPHTHALMIC | Status: DC | PRN
  Administered 2022-04-09 (×3): 1 [drp] via OPHTHALMIC

## 2022-04-09 MED ORDER — BRIMONIDINE TARTRATE-TIMOLOL 0.2-0.5 % OP SOLN
OPHTHALMIC | Status: DC | PRN
  Administered 2022-04-09: 1 [drp] via OPHTHALMIC

## 2022-04-09 MED ORDER — LACTATED RINGERS IV SOLN: INTRAVENOUS | Status: DC

## 2022-04-09 MED ORDER — ARMC OPHTHALMIC DILATING DROPS
1.0000 | OPHTHALMIC | Status: DC | PRN
  Administered 2022-04-09 (×3): 1 via OPHTHALMIC

## 2022-04-09 SURGICAL SUPPLY — 15 items
CANNULA ANT/CHMB 27G (MISCELLANEOUS) IMPLANT
CANNULA ANT/CHMB 27GA (MISCELLANEOUS) IMPLANT
CATARACT SUITE SIGHTPATH (MISCELLANEOUS) ×1 IMPLANT
FEE CATARACT SUITE SIGHTPATH (MISCELLANEOUS) ×1 IMPLANT
GLOVE BIOGEL PI IND STRL 8 (GLOVE) ×1 IMPLANT
GLOVE SURG ENC TEXT LTX SZ8 (GLOVE) ×1 IMPLANT
LENS IOL TECNIS EYHANCE 10.0 (Intraocular Lens) IMPLANT
NDL FILTER BLUNT 18X1 1/2 (NEEDLE) ×1 IMPLANT
NEEDLE FILTER BLUNT 18X1 1/2 (NEEDLE) ×1 IMPLANT
PACK VIT ANT 23G (MISCELLANEOUS) IMPLANT
RING MALYGIN (MISCELLANEOUS) IMPLANT
SUT ETHILON 10-0 CS-B-6CS-B-6 (SUTURE)
SUTURE EHLN 10-0 CS-B-6CS-B-6 (SUTURE) IMPLANT
SYR 3ML LL SCALE MARK (SYRINGE) ×1 IMPLANT
WATER STERILE IRR 250ML POUR (IV SOLUTION) ×1 IMPLANT

## 2022-04-09 NOTE — Op Note (Signed)
PREOPERATIVE DIAGNOSIS:  Nuclear sclerotic cataract of the left eye.   POSTOPERATIVE DIAGNOSIS:  Nuclear sclerotic cataract of the left eye.   OPERATIVE PROCEDURE:ORPROCALL@   SURGEON:  Birder Robson, MD.   ANESTHESIA:  Anesthesiologist: Molli Barrows, MD CRNA: Moises Blood, CRNA  1.      Managed anesthesia care. 2.     0.41m of Shugarcaine was instilled following the paracentesis   COMPLICATIONS:  None.   TECHNIQUE:   Stop and chop   DESCRIPTION OF PROCEDURE:  The patient was examined and consented in the preoperative holding area where the aforementioned topical anesthesia was applied to the left eye and then brought back to the Operating Room where the left eye was prepped and draped in the usual sterile ophthalmic fashion and a lid speculum was placed. A paracentesis was created with the side port blade and the anterior chamber was filled with viscoelastic. A near clear corneal incision was performed with the steel keratome. A continuous curvilinear capsulorrhexis was performed with a cystotome followed by the capsulorrhexis forceps. Hydrodissection and hydrodelineation were carried out with BSS on a blunt cannula. The lens was removed in a stop and chop  technique and the remaining cortical material was removed with the irrigation-aspiration handpiece. The capsular bag was inflated with viscoelastic and the Technis [id number] lens was placed in the capsular bag without complication. The remaining viscoelastic was removed from the eye with the irrigation-aspiration handpiece. The wounds were hydrated. The anterior chamber was flushed with BSS and the eye was inflated to physiologic pressure. 0.154mVigamox was placed in the anterior chamber. The wounds were found to be water tight. The eye was dressed with Combigan. The patient was given protective glasses to wear throughout the day and a shield with which to sleep tonight. The patient was also given drops with which to begin a drop regimen  today and will follow-up with me in one day. Implant Name Type Inv. Item Serial No. Manufacturer Lot No. LRB No. Used Action  LENS IOL TECNIS EYHANCE 10.0 - [id number] Lens LENS IOL TECNIS EYHANCE 10.0 [id number]  Left 1 Implanted    Procedure(s): CATARACT EXTRACTION PHACO AND INTRAOCULAR LENS PLACEMENT (IOC) LEFT 5.36 00:33.3 (Left)  Electronically signed: WiBirder Robson/06/2022 10:54 AM

## 2022-04-09 NOTE — Anesthesia Preprocedure Evaluation (Signed)
Anesthesia Evaluation  Patient identified by MRN, date of birth, ID band Patient awake    Reviewed: Allergy & Precautions, H&P , NPO status , Patient's Chart, lab work & pertinent test results, reviewed documented beta blocker date and time   Airway Mallampati: II  TM Distance: >3 FB Neck ROM: full    Dental no notable dental hx. (+) Teeth Intact   Pulmonary neg pulmonary ROS   Pulmonary exam normal breath sounds clear to auscultation       Cardiovascular Exercise Tolerance: Good hypertension, negative cardio ROS  Rhythm:regular Rate:Normal     Neuro/Psych negative neurological ROS  negative psych ROS   GI/Hepatic negative GI ROS, Neg liver ROS,,,  Endo/Other  negative endocrine ROSdiabetes    Renal/GU      Musculoskeletal   Abdominal   Peds  Hematology negative hematology ROS (+)   Anesthesia Other Findings   Reproductive/Obstetrics negative OB ROS                             Anesthesia Physical Anesthesia Plan  ASA: 2  Anesthesia Plan: MAC   Post-op Pain Management:    Induction:   PONV Risk Score and Plan:   Airway Management Planned:   Additional Equipment:   Intra-op Plan:   Post-operative Plan:   Informed Consent: I have reviewed the patients History and Physical, chart, labs and discussed the procedure including the risks, benefits and alternatives for the proposed anesthesia with the patient or authorized representative who has indicated his/her understanding and acceptance.       Plan Discussed with: CRNA  Anesthesia Plan Comments:        Anesthesia Quick Evaluation

## 2022-04-09 NOTE — Anesthesia Postprocedure Evaluation (Signed)
Anesthesia Post Note  Patient: Emyah Steakley  Procedure(s) Performed: CATARACT EXTRACTION PHACO AND INTRAOCULAR LENS PLACEMENT (IOC) LEFT 5.36 00:33.3 (Left: Eye)  Patient location during evaluation: PACU Anesthesia Type: MAC Level of consciousness: awake and alert Pain management: pain level controlled Vital Signs Assessment: post-procedure vital signs reviewed and stable Respiratory status: spontaneous breathing, nonlabored ventilation, respiratory function stable and patient connected to nasal cannula oxygen Cardiovascular status: stable and blood pressure returned to baseline Postop Assessment: no apparent nausea or vomiting Anesthetic complications: no   No notable events documented.   Last Vitals:  Vitals:   04/09/22 1055 04/09/22 1100  BP: 134/60 117/63  Pulse: 67 73  Resp: 13 19  Temp: 36.5 C 36.5 C  SpO2: 100% 100%    Last Pain:  Vitals:   04/09/22 1100  TempSrc:   PainSc: 0-No pain                 Molli Barrows
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
## 2022-04-09 NOTE — Transfer of Care (Signed)
Immediate Anesthesia Transfer of Care Note  Patient: Karina Simpson  Procedure(s) Performed: CATARACT EXTRACTION PHACO AND INTRAOCULAR LENS PLACEMENT (IOC) LEFT 5.36 00:33.3 (Left: Eye)  Patient Location: PACU  Anesthesia Type: MAC  Level of Consciousness: awake, alert  and patient cooperative  Airway and Oxygen Therapy: Patient Spontanous Breathing and Patient connected to supplemental oxygen  Post-op Assessment: Post-op Vital signs reviewed, Patient's Cardiovascular Status Stable, Respiratory Function Stable, Patent Airway and No signs of Nausea or vomiting  Post-op Vital Signs: Reviewed and stable  Complications: No notable events documented.

## 2022-04-10 ENCOUNTER — Encounter: Payer: Self-pay | Admitting: Ophthalmology

## 2022-04-17 NOTE — Discharge Instructions (Signed)

## 2022-04-23 ENCOUNTER — Ambulatory Visit: Payer: Medicare Other | Admitting: Anesthesiology

## 2022-04-23 ENCOUNTER — Encounter: Payer: Self-pay | Admitting: Ophthalmology

## 2022-04-23 ENCOUNTER — Encounter
Admission: RE | Disposition: A | Discharge status: Home / Self Care | Payer: Self-pay | Source: Ambulatory Visit | Attending: Ophthalmology

## 2022-04-23 ENCOUNTER — Other Ambulatory Visit: Payer: Self-pay

## 2022-04-23 ENCOUNTER — Ambulatory Visit
Admission: RE | Admit: 2022-04-23 | Discharge: 2022-04-23 | Disposition: A | Discharge status: Home / Self Care | Payer: Medicare Other | Source: Ambulatory Visit | Attending: Ophthalmology | Admitting: Ophthalmology

## 2022-04-23 DIAGNOSIS — H2511 Age-related nuclear cataract, right eye: Secondary | ICD-10-CM | POA: Diagnosis present

## 2022-04-23 HISTORY — PX: CATARACT EXTRACTION W/PHACO: SHX586

## 2022-04-23 SURGERY — PHACOEMULSIFICATION, CATARACT, WITH IOL INSERTION
Anesthesia: Monitor Anesthesia Care | Site: Eye | Laterality: Right

## 2022-04-23 MED ORDER — BRIMONIDINE TARTRATE-TIMOLOL 0.2-0.5 % OP SOLN
OPHTHALMIC | Status: DC | PRN
  Administered 2022-04-23: 1 [drp] via OPHTHALMIC

## 2022-04-23 MED ORDER — ARMC OPHTHALMIC DILATING DROPS
1.0000 | OPHTHALMIC | Status: DC | PRN
  Administered 2022-04-23 (×3): 1 via OPHTHALMIC

## 2022-04-23 MED ORDER — SIGHTPATH DOSE#1 BSS IO SOLN
INTRAOCULAR | Status: DC | PRN
  Administered 2022-04-23: 15 mL via INTRAOCULAR

## 2022-04-23 MED ORDER — MOXIFLOXACIN HCL 0.5 % OP SOLN
OPHTHALMIC | Status: DC | PRN
  Administered 2022-04-23: .2 mL via OPHTHALMIC

## 2022-04-23 MED ORDER — FENTANYL CITRATE (PF) 100 MCG/2ML IJ SOLN
INTRAMUSCULAR | Status: DC | PRN
  Administered 2022-04-23: 50 ug via INTRAVENOUS

## 2022-04-23 MED ORDER — SIGHTPATH DOSE#1 NA CHONDROIT SULF-NA HYALURON 40-17 MG/ML IO SOLN
INTRAOCULAR | Status: DC | PRN
  Administered 2022-04-23: 1 mL via INTRAOCULAR

## 2022-04-23 MED ORDER — MIDAZOLAM HCL 2 MG/2ML IJ SOLN
INTRAMUSCULAR | Status: DC | PRN
  Administered 2022-04-23 (×2): 1 mg via INTRAVENOUS

## 2022-04-23 MED ORDER — TETRACAINE HCL 0.5 % OP SOLN
1.0000 [drp] | OPHTHALMIC | Status: DC | PRN
  Administered 2022-04-23 (×3): 1 [drp] via OPHTHALMIC

## 2022-04-23 MED ORDER — SIGHTPATH DOSE#1 BSS IO SOLN
INTRAOCULAR | Status: DC | PRN
  Administered 2022-04-23: 2 mL

## 2022-04-23 MED ORDER — SIGHTPATH DOSE#1 BSS IO SOLN
INTRAOCULAR | Status: DC | PRN
  Administered 2022-04-23: 63 mL via OPHTHALMIC

## 2022-04-23 SURGICAL SUPPLY — 16 items
CANNULA ANT/CHMB 27G (MISCELLANEOUS) IMPLANT
CANNULA ANT/CHMB 27GA (MISCELLANEOUS) IMPLANT
CATARACT SUITE SIGHTPATH (MISCELLANEOUS) ×1 IMPLANT
FEE CATARACT SUITE SIGHTPATH (MISCELLANEOUS) ×1 IMPLANT
GLOVE BIOGEL PI IND STRL 8 (GLOVE) ×1 IMPLANT
GLOVE SURG ENC TEXT LTX SZ8 (GLOVE) ×1 IMPLANT
LENS IOL TECNIS EYHANCE 12.0 (Intraocular Lens) IMPLANT
NDL FILTER BLUNT 18X1 1/2 (NEEDLE) ×1 IMPLANT
NEEDLE FILTER BLUNT 18X1 1/2 (NEEDLE) ×1 IMPLANT
PACK VIT ANT 23G (MISCELLANEOUS) IMPLANT
RING MALYGIN (MISCELLANEOUS) IMPLANT
SUT ETHILON 10-0 CS-B-6CS-B-6 (SUTURE)
SUTURE EHLN 10-0 CS-B-6CS-B-6 (SUTURE) IMPLANT
SYR 3ML LL SCALE MARK (SYRINGE) ×1 IMPLANT
TIP ITREPID SGL USE BENT I/A (SUCTIONS) IMPLANT
WATER STERILE IRR 250ML POUR (IV SOLUTION) ×1 IMPLANT

## 2022-04-23 NOTE — Op Note (Signed)
PREOPERATIVE DIAGNOSIS:  Nuclear sclerotic cataract of the right eye.   POSTOPERATIVE DIAGNOSIS:  Cataract   OPERATIVE PROCEDURE:ORPROCALL@   SURGEON:  Birder Robson, MD.   ANESTHESIA:  Anesthesiologist: Ilene Qua, MD CRNA: Moises Blood, CRNA  1.      Managed anesthesia care. 2.      0.38ml of Shugarcaine was instilled in the eye following the paracentesis.   COMPLICATIONS:  None.   TECHNIQUE:   Stop and chop   DESCRIPTION OF PROCEDURE:  The patient was examined and consented in the preoperative holding area where the aforementioned topical anesthesia was applied to the right eye and then brought back to the Operating Room where the right eye was prepped and draped in the usual sterile ophthalmic fashion and a lid speculum was placed. A paracentesis was created with the side port blade and the anterior chamber was filled with viscoelastic. A near clear corneal incision was performed with the steel keratome. A continuous curvilinear capsulorrhexis was performed with a cystotome followed by the capsulorrhexis forceps. Hydrodissection and hydrodelineation were carried out with BSS on a blunt cannula. The lens was removed in a stop and chop  technique and the remaining cortical material was removed with the irrigation-aspiration handpiece. The capsular bag was inflated with viscoelastic and the Technis [id number]  lens was placed in the capsular bag without complication. The remaining viscoelastic was removed from the eye with the irrigation-aspiration handpiece. The wounds were hydrated. The anterior chamber was flushed with BSS and the eye was inflated to physiologic pressure. 0.31ml of Vigamox was placed in the anterior chamber. The wounds were found to be water tight. The eye was dressed with Combigan. The patient was given protective glasses to wear throughout the day and a shield with which to sleep tonight. The patient was also given drops with which to begin a drop regimen today and will  follow-up with me in one day. Implant Name Type Inv. Item Serial No. Manufacturer Lot No. LRB No. Used Action  LENS IOL TECNIS EYHANCE 12.0 - [id number] Intraocular Lens LENS IOL TECNIS EYHANCE 12.0 [id number] [employer]  Right 1 Implanted   Procedure(s): CATARACT EXTRACTION PHACO AND INTRAOCULAR LENS PLACEMENT (IOC) RIGHT 4.32 00:40.7 (Right)  Electronically signed: Birder Robson 04/23/2022 8:33 AM

## 2022-04-23 NOTE — Transfer of Care (Signed)
Immediate Anesthesia Transfer of Care Note  Patient: Karina Simpson  Procedure(s) Performed: CATARACT EXTRACTION PHACO AND INTRAOCULAR LENS PLACEMENT (IOC) RIGHT 4.32 00:40.7 (Right: Eye)  Patient Location: PACU  Anesthesia Type: MAC  Level of Consciousness: awake, alert  and patient cooperative  Airway and Oxygen Therapy: Patient Spontanous Breathing and Patient connected to supplemental oxygen  Post-op Assessment: Post-op Vital signs reviewed, Patient's Cardiovascular Status Stable, Respiratory Function Stable, Patent Airway and No signs of Nausea or vomiting  Post-op Vital Signs: Reviewed and stable  Complications: No notable events documented.
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
## 2022-04-23 NOTE — Anesthesia Preprocedure Evaluation (Addendum)
Anesthesia Evaluation  Patient identified by MRN, date of birth, ID band Patient awake    Reviewed: Allergy & Precautions, H&P , NPO status , Patient's Chart, lab work & pertinent test results, reviewed documented beta blocker date and time   History of Anesthesia Complications Negative for: history of anesthetic complications  Airway Mallampati: II  TM Distance: >3 FB Neck ROM: full    Dental no notable dental hx. (+) Teeth Intact   Pulmonary neg pulmonary ROS   Pulmonary exam normal breath sounds clear to auscultation       Cardiovascular Exercise Tolerance: Good hypertension, negative cardio ROS  Rhythm:regular Rate:Normal     Neuro/Psych negative neurological ROS  negative psych ROS   GI/Hepatic negative GI ROS, Neg liver ROS,,,  Endo/Other  negative endocrine ROSdiabetes    Renal/GU      Musculoskeletal   Abdominal   Peds  Hematology negative hematology ROS (+)   Anesthesia Other Findings   Reproductive/Obstetrics negative OB ROS                             Anesthesia Physical Anesthesia Plan  ASA: 2  Anesthesia Plan: MAC   Post-op Pain Management:    Induction: Intravenous  PONV Risk Score and Plan:   Airway Management Planned: Natural Airway and Nasal Cannula  Additional Equipment:   Intra-op Plan:   Post-operative Plan:   Informed Consent: I have reviewed the patients History and Physical, chart, labs and discussed the procedure including the risks, benefits and alternatives for the proposed anesthesia with the patient or authorized representative who has indicated his/her understanding and acceptance.     Dental Advisory Given  Plan Discussed with: CRNA  Anesthesia Plan Comments: (Patient consented for risks of anesthesia including but not limited to:  - adverse reactions to medications - damage to eyes, teeth, lips or other oral mucosa - nerve damage due  to positioning  - sore throat or hoarseness - Damage to heart, brain, nerves, lungs, other parts of body or loss of life  Patient voiced understanding.)       Anesthesia Quick Evaluation

## 2022-04-23 NOTE — Anesthesia Postprocedure Evaluation (Signed)
Anesthesia Post Note  Patient: Karina Simpson  Procedure(s) Performed: CATARACT EXTRACTION PHACO AND INTRAOCULAR LENS PLACEMENT (IOC) RIGHT 4.32 00:40.7 (Right: Eye)  Patient location during evaluation: PACU Anesthesia Type: MAC Level of consciousness: awake and alert Pain management: pain level controlled Vital Signs Assessment: post-procedure vital signs reviewed and stable Respiratory status: spontaneous breathing, nonlabored ventilation, respiratory function stable and patient connected to nasal cannula oxygen Cardiovascular status: stable and blood pressure returned to baseline Postop Assessment: no apparent nausea or vomiting Anesthetic complications: no   No notable events documented.   Last Vitals:  Vitals:   04/23/22 0833 04/23/22 0838  BP: 109/62 (!) 106/55  Pulse: 72   Resp: 18   Temp: (!) 36.3 C (!) 36.4 C  SpO2: 100%     Last Pain:  Vitals:   04/23/22 0838  TempSrc:   PainSc: 0-No pain                 Ilene Qua

## 2022-04-24 ENCOUNTER — Encounter: Payer: Self-pay | Admitting: Ophthalmology
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
## 2022-08-16 ENCOUNTER — Encounter
Admission: RE | Admit: 2022-08-16 | Discharge: 2022-08-16 | Disposition: A | Discharge status: Home / Self Care | Payer: Medicare Other | Source: Ambulatory Visit | Attending: Obstetrics and Gynecology | Admitting: Obstetrics and Gynecology

## 2022-08-16 VITALS — Ht 65.0 in | Wt 128.3 lb

## 2022-08-16 DIAGNOSIS — Z01810 Encounter for preprocedural cardiovascular examination: Secondary | ICD-10-CM

## 2022-08-16 DIAGNOSIS — R000 Tachycardia, unspecified: Secondary | ICD-10-CM

## 2022-08-16 HISTORY — DX: Fatty (change of) liver, not elsewhere classified: K76.0

## 2022-08-16 HISTORY — DX: Gastro-esophageal reflux disease without esophagitis: K21.9

## 2022-08-16 HISTORY — DX: Unspecified ovarian cyst, unspecified side: N83.209

## 2022-08-16 HISTORY — DX: Anemia, unspecified: D64.9

## 2022-08-16 HISTORY — DX: Polyp of corpus uteri: N84.0

## 2022-08-16 NOTE — Patient Instructions (Signed)
Your procedure is scheduled on:08-29-22 Thursday Report to the Registration Desk on the 1st floor of the Medical Mall.Then proceed to the 2nd floor Surgery Desk To find out your arrival time, please call [phone] between 1PM - 3PM on:08-28-22 Wednesday If your arrival time is 6:00 am, do not arrive before that time as the Medical Mall entrance doors do not open until 6:00 am.  REMEMBER: Instructions that are not followed completely may result in serious medical risk, up to and including death; or upon the discretion of your surgeon and anesthesiologist your surgery may need to be rescheduled.  Do not eat food after midnight the night before surgery.  No gum chewing or hard candies.  You may however, drink CLEAR liquids up to 2 hours before you are scheduled to arrive for your surgery. Do not drink anything within 2 hours of your scheduled arrival time.  Clear liquids include: - water  - apple juice without pulp - gatorade (not RED colors) - black coffee or tea (Do NOT add milk or creamers to the coffee or tea) Do NOT drink anything that is not on this list.  In addition, your doctor has ordered for you to drink the provided:  Ensure Pre-Surgery Clear Carbohydrate Drink  Drinking this carbohydrate drink up to two hours before surgery helps to reduce insulin resistance and improve patient outcomes. Please complete drinking 2 hours before scheduled arrival time.  One week prior to surgery:Last dose will be on 08-21-22 Stop Anti-inflammatories (NSAIDS) such as Advil, Aleve, Ibuprofen, Motrin, Naproxen, Naprosyn and Aspirin based products such as Excedrin, Goody's Powder, BC Powder.You may however, take Tylenol if needed for pain up until the day of surgery. Stop ANY OVER THE COUNTER supplements/vitamins 7 days prior to surgery (Vitamin B12, Vitamin D3, Calcium, Probiotic)  Continue taking all prescribed medications   TAKE ONLY THESE MEDICATIONS THE MORNING OF SURGERY WITH A SIP OF  WATER: -guaiFENesin (MUCINEX)  -metoprolol succinate (TOPROL-XL)   No Alcohol for 24 hours before or after surgery.  No Smoking including e-cigarettes for 24 hours before surgery.  No chewable tobacco products for at least 6 hours before surgery.  No nicotine patches on the day of surgery.  Do not use any "recreational" drugs for at least a week (preferably 2 weeks) before your surgery.  Please be advised that the combination of cocaine and anesthesia may have negative outcomes, up to and including death. If you test positive for cocaine, your surgery will be cancelled.  On the morning of surgery brush your teeth with toothpaste and water, you may rinse your mouth with mouthwash if you wish. Do not swallow any toothpaste or mouthwash.  Use CHG Soap as directed on instruction sheet.  Do not wear jewelry, make-up, hairpins, clips or nail polish.  Do not wear lotions, powders, or perfumes.   Do not shave body hair from the neck down 48 hours before surgery.  Contact lenses, hearing aids and dentures may not be worn into surgery.  Do not bring valuables to the hospital. [facility] is not responsible for any missing/lost belongings or valuables.   Notify your doctor if there is any change in your medical condition (cold, fever, infection).  Wear comfortable clothing (specific to your surgery type) to the hospital.  After surgery, you can help prevent lung complications by doing breathing exercises.  Take deep breaths and cough every 1-2 hours. Your doctor may order a device called an Incentive Spirometer to help you take deep breaths. When coughing  or sneezing, hold a pillow firmly against your incision with both hands. This is called "splinting." Doing this helps protect your incision. It also decreases belly discomfort.  If you are being admitted to the hospital overnight, leave your suitcase in the car. After surgery it may be brought to your room.  In case of increased patient  census, it may be necessary for you, the patient, to continue your postoperative care in the Same Day Surgery department.  If you are being discharged the day of surgery, you will not be allowed to drive home. You will need a responsible individual to drive you home and stay with you for 24 hours after surgery.   If you are taking public transportation, you will need to have a responsible individual with you.  Please call the Pre-admissions Testing Dept. at [phone] if you have any questions about these instructions.  Surgery Visitation Policy:  Patients having surgery or a procedure may have two visitors.  Children under the age of 37 must have an adult with them who is not the patient.     Preparing for Surgery with CHLORHEXIDINE GLUCONATE (CHG) Soap  Chlorhexidine Gluconate (CHG) Soap  o An antiseptic cleaner that kills germs and bonds with the skin to continue killing germs even after washing  o Used for showering the night before surgery and morning of surgery  Before surgery, you can play an important role by reducing the number of germs on your skin.  CHG (Chlorhexidine gluconate) soap is an antiseptic cleanser which kills germs and bonds with the skin to continue killing germs even after washing.  Please do not use if you have an allergy to CHG or antibacterial soaps. If your skin becomes reddened/irritated stop using the CHG.  1. Shower the NIGHT BEFORE SURGERY and the MORNING OF SURGERY with CHG soap.  2. If you choose to wash your hair, wash your hair first as usual with your normal shampoo.  3. After shampooing, rinse your hair and body thoroughly to remove the shampoo.  4. Use CHG as you would any other liquid soap. You can apply CHG directly to the skin and wash gently with a scrungie or a clean washcloth.  5. Apply the CHG soap to your body only from the neck down. Do not use on open wounds or open sores. Avoid contact with your eyes, ears, mouth, and  genitals (private parts). Wash face and genitals (private parts) with your normal soap.  6. Wash thoroughly, paying special attention to the area where your surgery will be performed.  7. Thoroughly rinse your body with warm water.  8. Do not shower/wash with your normal soap after using and rinsing off the CHG soap.  9. Pat yourself dry with a clean towel.  10. Wear clean pajamas to bed the night before surgery.  12. Place clean sheets on your bed the night of your first shower and do not sleep with pets.  13. Shower again with the CHG soap on the day of surgery prior to arriving at the hospital.  14. Do not apply any deodorants/lotions/powders.  15. Please wear clean clothes to the hospital.

## 2022-08-19 ENCOUNTER — Encounter
Admission: RE | Admit: 2022-08-19 | Discharge: 2022-08-19 | Disposition: A | Discharge status: Home / Self Care | Payer: Medicare Other | Source: Ambulatory Visit | Attending: Obstetrics and Gynecology | Admitting: Obstetrics and Gynecology

## 2022-08-19 DIAGNOSIS — N840 Polyp of corpus uteri: Secondary | ICD-10-CM | POA: Diagnosis not present

## 2022-08-19 DIAGNOSIS — N83201 Unspecified ovarian cyst, right side: Secondary | ICD-10-CM | POA: Diagnosis not present

## 2022-08-19 DIAGNOSIS — Z01810 Encounter for preprocedural cardiovascular examination: Secondary | ICD-10-CM

## 2022-08-19 DIAGNOSIS — R000 Tachycardia, unspecified: Secondary | ICD-10-CM | POA: Insufficient documentation

## 2022-08-19 DIAGNOSIS — Z01818 Encounter for other preprocedural examination: Secondary | ICD-10-CM | POA: Diagnosis not present

## 2022-08-19 LAB — TYPE AND SCREEN
ABO/RH(D): B NEG
Antibody Screen: NEGATIVE

## 2022-08-29 ENCOUNTER — Encounter
Admission: RE | Disposition: A | Discharge status: Home / Self Care | Payer: Self-pay | Source: Home / Self Care | Attending: Obstetrics and Gynecology

## 2022-08-29 ENCOUNTER — Other Ambulatory Visit: Payer: Self-pay

## 2022-08-29 ENCOUNTER — Ambulatory Visit: Payer: Self-pay | Admitting: Certified Registered"

## 2022-08-29 ENCOUNTER — Ambulatory Visit: Payer: Medicare Other | Admitting: Certified Registered"

## 2022-08-29 ENCOUNTER — Ambulatory Visit
Admission: RE | Admit: 2022-08-29 | Discharge: 2022-08-29 | Disposition: A | Discharge status: Home / Self Care | Payer: Medicare Other | Attending: Obstetrics and Gynecology | Admitting: Obstetrics and Gynecology

## 2022-08-29 ENCOUNTER — Encounter: Payer: Self-pay | Admitting: Obstetrics and Gynecology

## 2022-08-29 DIAGNOSIS — N840 Polyp of corpus uteri: Secondary | ICD-10-CM | POA: Clinically undetermined

## 2022-08-29 DIAGNOSIS — Z01812 Encounter for preprocedural laboratory examination: Secondary | ICD-10-CM

## 2022-08-29 DIAGNOSIS — Z79899 Other long term (current) drug therapy: Secondary | ICD-10-CM | POA: Diagnosis not present

## 2022-08-29 DIAGNOSIS — K219 Gastro-esophageal reflux disease without esophagitis: Secondary | ICD-10-CM | POA: Diagnosis not present

## 2022-08-29 DIAGNOSIS — N838 Other noninflammatory disorders of ovary, fallopian tube and broad ligament: Secondary | ICD-10-CM | POA: Insufficient documentation

## 2022-08-29 DIAGNOSIS — E0590 Thyrotoxicosis, unspecified without thyrotoxic crisis or storm: Secondary | ICD-10-CM | POA: Diagnosis not present

## 2022-08-29 DIAGNOSIS — D271 Benign neoplasm of left ovary: Secondary | ICD-10-CM | POA: Insufficient documentation

## 2022-08-29 DIAGNOSIS — N83201 Unspecified ovarian cyst, right side: Secondary | ICD-10-CM | POA: Diagnosis present

## 2022-08-29 DIAGNOSIS — M810 Age-related osteoporosis without current pathological fracture: Secondary | ICD-10-CM | POA: Diagnosis not present

## 2022-08-29 DIAGNOSIS — D270 Benign neoplasm of right ovary: Secondary | ICD-10-CM | POA: Insufficient documentation

## 2022-08-29 DIAGNOSIS — K760 Fatty (change of) liver, not elsewhere classified: Secondary | ICD-10-CM | POA: Insufficient documentation

## 2022-08-29 HISTORY — PX: ROBOTIC ASSISTED BILATERAL SALPINGO OOPHERECTOMY: SHX6078

## 2022-08-29 HISTORY — PX: HYSTEROSCOPY WITH D & C: SHX1775

## 2022-08-29 LAB — ABO/RH: ABO/RH(D): B NEG

## 2022-08-29 SURGERY — SALPINGO-OOPHORECTOMY, BILATERAL, ROBOT-ASSISTED
Anesthesia: General | Site: Uterus

## 2022-08-29 MED ORDER — OXYCODONE HCL 5 MG PO TABS
5.0000 mg | ORAL_TABLET | Freq: Once | ORAL | Status: AC | PRN
  Administered 2022-08-29: 5 mg via ORAL

## 2022-08-29 MED ORDER — DEXAMETHASONE SODIUM PHOSPHATE 10 MG/ML IJ SOLN
INTRAMUSCULAR | Status: AC
  Filled 2022-08-29: qty 1

## 2022-08-29 MED ORDER — PROPOFOL 10 MG/ML IV BOLUS
INTRAVENOUS | Status: AC
  Filled 2022-08-29: qty 20

## 2022-08-29 MED ORDER — 0.9 % SODIUM CHLORIDE (POUR BTL) OPTIME
TOPICAL | Status: DC | PRN
  Administered 2022-08-29: 500 mL

## 2022-08-29 MED ORDER — OXYCODONE HCL 5 MG/5ML PO SOLN: 5.0000 mg | Freq: Once | ORAL | Status: AC | PRN

## 2022-08-29 MED ORDER — HYDROMORPHONE HCL 1 MG/ML IJ SOLN
INTRAMUSCULAR | Status: DC | PRN
  Administered 2022-08-29 (×2): .5 mg via INTRAVENOUS

## 2022-08-29 MED ORDER — DROPERIDOL 2.5 MG/ML IJ SOLN
0.6250 mg | Freq: Once | INTRAMUSCULAR | Status: AC
  Administered 2022-08-29: 0.625 mg via INTRAVENOUS
  Filled 2022-08-29: qty 2

## 2022-08-29 MED ORDER — HYDROCODONE-ACETAMINOPHEN 5-325 MG PO TABS
1.0000 | ORAL_TABLET | Freq: Four times a day (QID) | ORAL | 0 refills | Status: AC | PRN
Start: 2022-08-29 — End: ?

## 2022-08-29 MED ORDER — LIDOCAINE HCL (CARDIAC) PF 100 MG/5ML IV SOSY
PREFILLED_SYRINGE | INTRAVENOUS | Status: DC | PRN
  Administered 2022-08-29: 60 mg via INTRAVENOUS

## 2022-08-29 MED ORDER — ROCURONIUM BROMIDE 100 MG/10ML IV SOLN
INTRAVENOUS | Status: DC | PRN
  Administered 2022-08-29: 50 mg via INTRAVENOUS
  Administered 2022-08-29: 10 mg via INTRAVENOUS

## 2022-08-29 MED ORDER — PHENYLEPHRINE HCL (PRESSORS) 10 MG/ML IV SOLN
INTRAVENOUS | Status: DC | PRN
  Administered 2022-08-29: 100 ug via INTRAVENOUS
  Administered 2022-08-29: 200 ug via INTRAVENOUS

## 2022-08-29 MED ORDER — ONDANSETRON HCL 4 MG/2ML IJ SOLN
INTRAMUSCULAR | Status: AC
  Filled 2022-08-29: qty 2

## 2022-08-29 MED ORDER — LACTATED RINGERS IV SOLN: INTRAVENOUS | Status: DC

## 2022-08-29 MED ORDER — PROPOFOL 1000 MG/100ML IV EMUL
INTRAVENOUS | Status: AC
  Filled 2022-08-29: qty 100

## 2022-08-29 MED ORDER — MIDAZOLAM HCL 2 MG/2ML IJ SOLN
INTRAMUSCULAR | Status: DC | PRN
  Administered 2022-08-29: 2 mg via INTRAVENOUS

## 2022-08-29 MED ORDER — ORAL CARE MOUTH RINSE: 15.0000 mL | Freq: Once | OROMUCOSAL | Status: AC

## 2022-08-29 MED ORDER — DEXAMETHASONE SODIUM PHOSPHATE 10 MG/ML IJ SOLN
INTRAMUSCULAR | Status: DC | PRN
  Administered 2022-08-29: 5 mg via INTRAVENOUS

## 2022-08-29 MED ORDER — FAMOTIDINE 20 MG PO TABS
ORAL_TABLET | ORAL | Status: AC
  Filled 2022-08-29: qty 1

## 2022-08-29 MED ORDER — FENTANYL CITRATE (PF) 100 MCG/2ML IJ SOLN
INTRAMUSCULAR | Status: AC
  Filled 2022-08-29: qty 2

## 2022-08-29 MED ORDER — FAMOTIDINE 20 MG PO TABS
20.0000 mg | ORAL_TABLET | Freq: Once | ORAL | Status: AC
  Administered 2022-08-29: 20 mg via ORAL

## 2022-08-29 MED ORDER — BUPIVACAINE HCL 0.5 % IJ SOLN
INTRAMUSCULAR | Status: DC | PRN
  Administered 2022-08-29: 9 mL

## 2022-08-29 MED ORDER — CHLORHEXIDINE GLUCONATE 0.12 % MT SOLN
15.0000 mL | Freq: Once | OROMUCOSAL | Status: AC
  Administered 2022-08-29: 15 mL via OROMUCOSAL

## 2022-08-29 MED ORDER — DROPERIDOL 2.5 MG/ML IJ SOLN
INTRAMUSCULAR | Status: AC
  Filled 2022-08-29: qty 2

## 2022-08-29 MED ORDER — FENTANYL CITRATE (PF) 100 MCG/2ML IJ SOLN
INTRAMUSCULAR | Status: DC | PRN
  Administered 2022-08-29: 50 ug via INTRAVENOUS
  Administered 2022-08-29 (×2): 25 ug via INTRAVENOUS

## 2022-08-29 MED ORDER — OXYCODONE HCL 5 MG PO TABS
ORAL_TABLET | ORAL | Status: AC
  Filled 2022-08-29: qty 1

## 2022-08-29 MED ORDER — BUPIVACAINE HCL (PF) 0.5 % IJ SOLN
INTRAMUSCULAR | Status: AC
  Filled 2022-08-29: qty 30

## 2022-08-29 MED ORDER — ONDANSETRON HCL 4 MG/2ML IJ SOLN
INTRAMUSCULAR | Status: DC | PRN
Start: 2022-08-29 — End: 2022-08-29
  Administered 2022-08-29: 4 mg via INTRAVENOUS

## 2022-08-29 MED ORDER — SODIUM CHLORIDE 0.9 % IR SOLN
Status: DC | PRN
  Administered 2022-08-29: 3000 mL

## 2022-08-29 MED ORDER — SILVER NITRATE-POT NITRATE 75-25 % EX MISC
CUTANEOUS | Status: AC
  Filled 2022-08-29: qty 10

## 2022-08-29 MED ORDER — MIDAZOLAM HCL 2 MG/2ML IJ SOLN
INTRAMUSCULAR | Status: AC
  Filled 2022-08-29: qty 2

## 2022-08-29 MED ORDER — FENTANYL CITRATE (PF) 100 MCG/2ML IJ SOLN
25.0000 ug | INTRAMUSCULAR | Status: DC | PRN
  Administered 2022-08-29 (×2): 25 ug via INTRAVENOUS

## 2022-08-29 MED ORDER — SUGAMMADEX SODIUM 200 MG/2ML IV SOLN
INTRAVENOUS | Status: DC | PRN
  Administered 2022-08-29: 200 mg via INTRAVENOUS

## 2022-08-29 MED ORDER — NAPROXEN SODIUM 220 MG PO TABS
220.0000 mg | ORAL_TABLET | Freq: Two times a day (BID) | ORAL | Status: AC | PRN
Start: 2022-08-29 — End: ?

## 2022-08-29 MED ORDER — PROPOFOL 10 MG/ML IV BOLUS
INTRAVENOUS | Status: DC | PRN
Start: 2022-08-29 — End: 2022-08-29
  Administered 2022-08-29: 120 mg via INTRAVENOUS

## 2022-08-29 MED ORDER — CHLORHEXIDINE GLUCONATE 0.12 % MT SOLN
OROMUCOSAL | Status: AC
  Filled 2022-08-29: qty 15

## 2022-08-29 MED ORDER — SEVOFLURANE IN SOLN
RESPIRATORY_TRACT | Status: AC
  Filled 2022-08-29: qty 250

## 2022-08-29 MED ORDER — HYDROMORPHONE HCL 1 MG/ML IJ SOLN
INTRAMUSCULAR | Status: AC
  Filled 2022-08-29: qty 1

## 2022-08-29 MED ORDER — SILVER NITRATE-POT NITRATE 75-25 % EX MISC
CUTANEOUS | Status: DC | PRN
  Administered 2022-08-29: 2

## 2022-08-29 SURGICAL SUPPLY — 68 items
ADH SKN CLS APL DERMABOND .7 (GAUZE/BANDAGES/DRESSINGS) ×2
ANCHOR TIS RET SYS 235ML (MISCELLANEOUS) IMPLANT
BAG DRN RND TRDRP ANRFLXCHMBR (UROLOGICAL SUPPLIES) ×2
BAG TISS RTRVL C235 10X14 (MISCELLANEOUS) ×2
BAG URINE DRAIN 2000ML AR STRL (UROLOGICAL SUPPLIES) ×2 IMPLANT
CATH FOLEY 2WAY 5CC 16FR (CATHETERS) ×2
CATH URTH 16FR FL 2W BLN LF (CATHETERS) ×2 IMPLANT
COVER TIP SHEARS 8 DVNC (MISCELLANEOUS) IMPLANT
COVER WAND RF STERILE (DRAPES) ×2 IMPLANT
DERMABOND ADVANCED .7 DNX12 (GAUZE/BANDAGES/DRESSINGS) ×2 IMPLANT
DEVICE MYOSURE LITE (MISCELLANEOUS) IMPLANT
DEVICE MYOSURE REACH (MISCELLANEOUS) IMPLANT
DRAPE 3/4 80X56 (DRAPES) ×2 IMPLANT
DRAPE ARM DVNC X/XI (DISPOSABLE) ×6 IMPLANT
DRAPE COLUMN DVNC XI (DISPOSABLE) ×2 IMPLANT
DRAPE ROBOT W/ LEGGING 30X125 (DRAPES) ×2 IMPLANT
DRAPE UNDER BUTTOCK W/FLU (DRAPES) ×2 IMPLANT
DRSG TELFA 3X8 NADH STRL (GAUZE/BANDAGES/DRESSINGS) IMPLANT
ELECT REM PT RETURN 9FT ADLT (ELECTROSURGICAL) ×2
ELECTRODE REM PT RTRN 9FT ADLT (ELECTROSURGICAL) ×2 IMPLANT
FORCEPS BPLR 8 MD DVNC XI (FORCEP) ×2 IMPLANT
FORCEPS BPLR R/ABLATION 8 DVNC (INSTRUMENTS) ×2 IMPLANT
GLOVE BIO SURGEON STRL SZ7 (GLOVE) ×4 IMPLANT
GLOVE BIOGEL PI IND STRL 7.5 (GLOVE) ×6 IMPLANT
GOWN STRL REUS W/ TWL LRG LVL3 (GOWN DISPOSABLE) ×6 IMPLANT
GOWN STRL REUS W/TWL LRG LVL3 (GOWN DISPOSABLE) ×6
GRASPER SUT TROCAR 14GX15 (MISCELLANEOUS) IMPLANT
IRRIGATION STRYKERFLOW (MISCELLANEOUS) IMPLANT
IRRIGATOR STRYKERFLOW (MISCELLANEOUS)
IV NS 1000ML (IV SOLUTION) ×2
IV NS 1000ML BAXH (IV SOLUTION) ×2 IMPLANT
IV NS IRRIG 3000ML ARTHROMATIC (IV SOLUTION) ×2 IMPLANT
KIT PINK PAD W/HEAD ARE REST (MISCELLANEOUS) ×2
KIT PINK PAD W/HEAD ARM REST (MISCELLANEOUS) ×2 IMPLANT
KIT PROCEDURE FLUENT (KITS) ×2 IMPLANT
KIT TURNOVER CYSTO (KITS) ×2 IMPLANT
LABEL OR SOLS (LABEL) ×2 IMPLANT
MANIFOLD NEPTUNE II (INSTRUMENTS) ×2 IMPLANT
NS IRRIG 1000ML POUR BTL (IV SOLUTION) ×2 IMPLANT
OBTURATOR OPTICAL STND 8 DVNC (TROCAR) ×2
OBTURATOR OPTICALSTD 8 DVNC (TROCAR) ×2 IMPLANT
PACK DNC HYST (MISCELLANEOUS) ×2 IMPLANT
PACK LAP CHOLECYSTECTOMY (MISCELLANEOUS) ×2 IMPLANT
PAD OB MATERNITY 4.3X12.25 (PERSONAL CARE ITEMS) ×2 IMPLANT
PAD PREP OB/GYN DISP 24X41 (PERSONAL CARE ITEMS) ×2 IMPLANT
SCISSORS MNPLR CVD DVNC XI (INSTRUMENTS) ×2 IMPLANT
SCRUB CHG 4% DYNA-HEX 4OZ (MISCELLANEOUS) ×2 IMPLANT
SEAL ROD LENS SCOPE MYOSURE (ABLATOR) ×2 IMPLANT
SEAL UNIV 5-12 XI (MISCELLANEOUS) ×6 IMPLANT
SEALER VESSEL EXT DVNC XI (MISCELLANEOUS) IMPLANT
SET CYSTO W/LG BORE CLAMP LF (SET/KITS/TRAYS/PACK) IMPLANT
SOL ELECTROSURG ANTI STICK (MISCELLANEOUS) ×2
SOLUTION ELECTROSURG ANTI STCK (MISCELLANEOUS) ×2 IMPLANT
SPONGE T-LAP 18X18 ~~LOC~~+RFID (SPONGE) IMPLANT
SURGILUBE 2OZ TUBE FLIPTOP (MISCELLANEOUS) ×2 IMPLANT
SUT MNCRL 4-0 (SUTURE) ×2
SUT MNCRL 4-0 27XMFL (SUTURE) ×2
SUT VIC AB 0 CT2 27 (SUTURE) IMPLANT
SUT VICRYL 0 UR6 27IN ABS (SUTURE) IMPLANT
SUT VLOC 90 S/L VL9 GS22 (SUTURE) IMPLANT
SUTURE MNCRL 4-0 27XMF (SUTURE) ×2 IMPLANT
SYR 10ML LL (SYRINGE) ×2 IMPLANT
TAPE TRANSPORE STRL 2 31045 (GAUZE/BANDAGES/DRESSINGS) IMPLANT
TOWEL OR 17X26 4PK STRL BLUE (TOWEL DISPOSABLE) ×2 IMPLANT
TRAP FLUID SMOKE EVACUATOR (MISCELLANEOUS) ×2 IMPLANT
TUBING CONNECTING 10 (TUBING) ×2 IMPLANT
TUBING EVAC SMOKE HEATED PNEUM (TUBING) ×2 IMPLANT
WATER STERILE IRR 500ML POUR (IV SOLUTION) ×2 IMPLANT

## 2022-08-29 NOTE — Interval H&P Note (Signed)
History and Physical Interval Note:  08/29/2022 7:22 AM  Karina Simpson  has presented today for surgery, with the diagnosis of right ovarian cyst, endometrial polyp.  The various methods of treatment have been discussed with the patient and family. After consideration of risks, benefits and other options for treatment, the patient has consented to  Procedure(s): XI ROBOTIC ASSISTED BILATERAL SALPINGO OOPHORECTOMY (Bilateral) DILATATION AND CURETTAGE /HYSTEROSCOPY, POLYPECTOMY (N/A) as a surgical intervention.  The patient's history has been reviewed, patient examined, no change in status, stable for surgery.  I have reviewed the patient's chart and labs.  Questions were answered to the patient's satisfaction.    Thomasene Mohair, MD, [facility] OB/GYN 08/29/2022 7:22 AM

## 2022-08-29 NOTE — Transfer of Care (Signed)
Immediate Anesthesia Transfer of Care Note  Patient: Karina Simpson  Procedure(s) Performed: XI ROBOTIC ASSISTED BILATERAL SALPINGO OOPHORECTOMY (Bilateral) DILATATION AND CURETTAGE /HYSTEROSCOPY, POLYPECTOMY (Uterus)  Patient Location: PACU  Anesthesia Type:General  Level of Consciousness: awake and alert   Airway & Oxygen Therapy: Patient Spontanous Breathing and Patient connected to face mask oxygen  Post-op Assessment: Report given to RN and Post -op Vital signs reviewed and stable  Post vital signs: Reviewed and stable  Last Vitals:  Vitals Value Taken Time  BP 115/62 08/29/22 0951  Temp 36.2 C 08/29/22 0951  Pulse 68 08/29/22 0955  Resp 11 08/29/22 0955  SpO2 100 % 08/29/22 0955    Last Pain:  Vitals:   08/29/22 0613  TempSrc: Temporal  PainSc: 0-No pain      Patients Stated Pain Goal: 0 (08/29/22 9528)  Complications: No notable events documented.

## 2022-08-29 NOTE — Op Note (Signed)
Operative Note    Name: Karina Simpson  Date of Service: 08/29/2022  DOB: 05-11-1955  MRN: [id number]   Pre-Operative Diagnosis:  1) Persistent right ovarian cyst 2) Intermittent  left ovarian cyst 3) suspected endometrial polyp  Post-Operative Diagnosis:  1) Persistent right ovarian cyst 2) Intermittent  left ovarian cyst 3) no evidence of endometrial polyp  Procedures:  1)  Robot-assisted laparoscopic bilateral salpingo-oophorectomy 2)  Hysteroscopy with dilation and curettage  Primary Surgeon: Thomasene Mohair, MD   EBL: 10 mL   IVF: 1,000 mL   Urine output: 50 mL  Specimens:  1) right ovary, fallopian tube, cyst 2) left ovary and fallopian tube 3) endometrial curettings  Drains: none  Complications: None   Disposition: PACU   Condition: Stable   Findings:  1) normal-appearing uterus and bilateral fallopian tubes. 2) right ovary with cyst and paratubal cyst 3) normal-appearing left ovary 4) normal-appearing endometrial cavity without evidence of polyp or polypoid lesion.  Procedure Summary:  The patient was taken to the operating room where general anesthesia was administered and found to be adequate. She was placed in the dorsal supine lithotomy position in [place] stirrups and prepped and draped in usual sterile fashion. After a timeout was called an indwelling catheter was placed in her bladder.  A sponge on a stick was placed in her vagina for uterine manipulation.  Attention was turned to the abdomen where after injection of local anesthetic, an 8 mm infraumbilical incision was made with the scalpel. Entry into the abdomen was obtained via Optiview trocar technique (a blunt entry technique with camera visualization through the obturator upon entry). Verification of entry into the abdomen was obtained using opening pressures. The abdomen was insufflated with CO2. The camera was introduced through the trocar with verification of atraumatic entry.  Right and left  abdominal entry sites were created after injection of local anesthetic about 9 cm away from the umbilical port in accordance with the Intuitive manufacturer's recommendations.  The port sites were 8 mm.  The intuitive trochars were introduced under intra-abdominal camera visualization without difficulty.  The XI robot was docked on the patient's left.  Clearance was verified from the patient's legs.  Through the umbilical port the camera was placed.  Through the port attached to arm 4 the monopolar scissors were placed.  Through the port attached to arm 2 the forced bipolar forceps were was placed.    A survey was taken of the abdomen and pelvis.  The right ureter was identified and found to be well below the posterior broad ligament.  With visualization of the right ureter, the broad ligament was opened between the ovary and the right round ligament.  This opening was extended and parallel to the infundibulopelvic ligament.  The IP ligament was skeletonized.  The infundibulopelvic ligament was cauterized and transected.  The utero-ovarian ligament was cauterized and transected.  The specimen was placed in the posterior cul-de-sac.  Hemostasis was noted at the vascular pedicles.  The same procedure was carried out on the left side with great care to visualize the left ureter.  Similarly, the vascular pedicles were visualized and found to be hemostatic.  The pressure in the abdomen was dropped to 5 mmHg and ongoing hemostasis was noted.  The instruments were removed from the patient's abdomen and the robot was undocked and removed from the patient bedside.  Under direct intra-abdominal camera visualization the umbilical 8 mm trocar was removed, the incision was extended, and the 12 mm trocar was placed  without difficulty.  The specimen retrieval bag was introduced through the 12 mm trocar and the right fallopian tube and ovary and cyst were placed in the bag.  The bag was brought to the skin opening and removed  from the bag.  The bag was reintroduced and the left ovary and fallopian tube were placed into the bag and similarly removed.  The 12 mm trocar was removed and the fascia was reapproximated using 0 Vicryl using a single interrupted stitch.  The abdomen was emptied of CO2 with the aid of 5 deep breaths from anesthesia.  The remaining 2 trocars were removed.  The skin sites were closed using 4-0 Monocryl in a subcuticular fashion.  The closures were reinforced using surgical skin glue.  Attention was turned to the pelvic portion of the procedure. The bi-valved speculum was placed in the patient's vagina, and the the anterior lip of the cervix was grasped with the tenaculum.  Then the cervix was progressively dilated to a 7 mm Hegar dilator.  The hysteroscope was introduced, with the above noted findings. The hystersocope was removed and the uterine cavity was curetted until a gritty texture was noted, yielding a small amount of endometrial curettings.  The camera was reintroduced to verify hemostasis as the pressure and the uterus was lowered.  The camera was removed.  All instruments were removed, with excellent hemostasis noted with the use of silver nitrate on the tenaculum entry sites on the cervix.  She was then taken out of dorsal lithotomy.  The patient tolerated the procedure well.  Sponge, lap, needle, and instrument counts were correct x 2.  VTE prophylaxis: SCDs. Antibiotic prophylaxis: none indicated. She was awakened in the operating room and was taken to the PACU in stable condition.   Thomasene Mohair, MD 08/29/2022 9:49 AM

## 2022-08-29 NOTE — Discharge Instructions (Signed)

## 2022-08-29 NOTE — Anesthesia Postprocedure Evaluation (Signed)
Anesthesia Post Note  Patient: Karina Simpson  Procedure(s) Performed: XI ROBOTIC ASSISTED BILATERAL SALPINGO OOPHORECTOMY (Bilateral) DILATATION AND CURETTAGE /HYSTEROSCOPY, POLYPECTOMY (Uterus)  Patient location during evaluation: PACU Anesthesia Type: General Level of consciousness: awake and alert Pain management: pain level controlled Vital Signs Assessment: post-procedure vital signs reviewed and stable Respiratory status: spontaneous breathing, nonlabored ventilation, respiratory function stable and patient connected to nasal cannula oxygen Cardiovascular status: blood pressure returned to baseline and stable Postop Assessment: no apparent nausea or vomiting Anesthetic complications: no   No notable events documented.   Last Vitals:  Vitals:   08/29/22 0951 08/29/22 0955  BP: 115/62   Pulse: 77 68  Resp: (!) 9 11  Temp: (!) 36.2 C   SpO2: 100% 100%    Last Pain:  Vitals:   08/29/22 0951  TempSrc:   PainSc: 0-No pain                 Corinda Gubler

## 2022-08-29 NOTE — Anesthesia Procedure Notes (Signed)
Procedure Name: Intubation Date/Time: 08/29/2022 7:42 AM  Performed by: Monico Hoar, CRNAPre-anesthesia Checklist: Patient identified, Patient being monitored, Timeout performed, Emergency Drugs available and Suction available Patient Re-evaluated:Patient Re-evaluated prior to induction Oxygen Delivery Method: Circle system utilized Preoxygenation: Pre-oxygenation with 100% oxygen Induction Type: IV induction Ventilation: Mask ventilation without difficulty Laryngoscope Size: 3 and McGraph Grade View: Grade I Tube type: Oral Tube size: 7.0 mm Number of attempts: 1 Airway Equipment and Method: Stylet Placement Confirmation: ETT inserted through vocal cords under direct vision, positive ETCO2 and breath sounds checked- equal and bilateral Secured at: 21 cm Tube secured with: Tape Dental Injury: Teeth and Oropharynx as per pre-operative assessment
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
## 2022-08-30 ENCOUNTER — Encounter: Payer: Self-pay | Admitting: Obstetrics and Gynecology

## 2022-09-16 ENCOUNTER — Other Ambulatory Visit: Payer: Self-pay | Admitting: Obstetrics and Gynecology

## 2022-09-16 DIAGNOSIS — Z1231 Encounter for screening mammogram for malignant neoplasm of breast: Secondary | ICD-10-CM

## 2022-10-22 ENCOUNTER — Ambulatory Visit
Admission: RE | Admit: 2022-10-22 | Discharge: 2022-10-22 | Disposition: A | Discharge status: Home / Self Care | Payer: Medicare Other | Source: Ambulatory Visit | Attending: Obstetrics and Gynecology | Admitting: Obstetrics and Gynecology

## 2022-10-22 DIAGNOSIS — Z1231 Encounter for screening mammogram for malignant neoplasm of breast: Secondary | ICD-10-CM | POA: Diagnosis present

## 2023-09-18 ENCOUNTER — Other Ambulatory Visit: Payer: Self-pay | Admitting: Obstetrics and Gynecology

## 2023-09-18 DIAGNOSIS — Z1231 Encounter for screening mammogram for malignant neoplasm of breast: Secondary | ICD-10-CM

## 2023-10-23 ENCOUNTER — Ambulatory Visit
Admission: RE | Admit: 2023-10-23 | Discharge: 2023-10-23 | Disposition: A | Discharge status: Home / Self Care | Source: Ambulatory Visit | Attending: Obstetrics and Gynecology | Admitting: Obstetrics and Gynecology

## 2023-10-23 DIAGNOSIS — Z1231 Encounter for screening mammogram for malignant neoplasm of breast: Secondary | ICD-10-CM | POA: Insufficient documentation
